# Patient Record
Sex: Female | Born: 1970
Health system: Southern US, Community
[De-identification: ages and names within clinical notes are randomized; demographics above are authoritative.]

## PROBLEM LIST (undated history)

## (undated) DIAGNOSIS — E785 Hyperlipidemia, unspecified: Secondary | ICD-10-CM

## (undated) DIAGNOSIS — E0590 Thyrotoxicosis, unspecified without thyrotoxic crisis or storm: Secondary | ICD-10-CM

## (undated) DIAGNOSIS — D649 Anemia, unspecified: Secondary | ICD-10-CM

## (undated) DIAGNOSIS — I10 Essential (primary) hypertension: Secondary | ICD-10-CM

## (undated) DIAGNOSIS — E039 Hypothyroidism, unspecified: Secondary | ICD-10-CM

## (undated) HISTORY — PX: KIDNEY STONE SURGERY: SHX686

## (undated) HISTORY — DX: Anemia, unspecified: D64.9

## (undated) HISTORY — DX: Thyrotoxicosis, unspecified without thyrotoxic crisis or storm: E05.90

## (undated) HISTORY — PX: ENDOMETRIAL ABLATION: SHX621

## (undated) HISTORY — DX: Essential (primary) hypertension: I10

## (undated) HISTORY — DX: Hyperlipidemia, unspecified: E78.5

---

## 1998-09-19 ENCOUNTER — Other Ambulatory Visit: Admission: RE | Admit: 1998-09-19 | Discharge: 1998-09-19 | Payer: Self-pay | Admitting: Obstetrics and Gynecology

## 1998-11-26 ENCOUNTER — Other Ambulatory Visit: Admission: RE | Admit: 1998-11-26 | Discharge: 1998-11-26 | Payer: Self-pay | Admitting: Obstetrics and Gynecology

## 1999-01-16 ENCOUNTER — Other Ambulatory Visit: Admission: RE | Admit: 1999-01-16 | Discharge: 1999-01-16 | Payer: Self-pay | Admitting: Obstetrics and Gynecology

## 1999-07-01 ENCOUNTER — Other Ambulatory Visit: Admission: RE | Admit: 1999-07-01 | Discharge: 1999-07-01 | Payer: Self-pay | Admitting: Obstetrics and Gynecology

## 1999-10-29 ENCOUNTER — Encounter (INDEPENDENT_AMBULATORY_CARE_PROVIDER_SITE_OTHER): Payer: Self-pay

## 1999-10-29 ENCOUNTER — Other Ambulatory Visit: Admission: RE | Admit: 1999-10-29 | Discharge: 1999-10-29 | Payer: Self-pay | Admitting: Obstetrics and Gynecology

## 2000-02-06 ENCOUNTER — Other Ambulatory Visit: Admission: RE | Admit: 2000-02-06 | Discharge: 2000-02-06 | Payer: Self-pay | Admitting: Obstetrics and Gynecology

## 2000-08-30 ENCOUNTER — Other Ambulatory Visit: Admission: RE | Admit: 2000-08-30 | Discharge: 2000-08-30 | Payer: Self-pay | Admitting: Obstetrics and Gynecology

## 2001-02-21 ENCOUNTER — Encounter: Payer: Self-pay | Admitting: Emergency Medicine

## 2001-02-21 ENCOUNTER — Emergency Department (HOSPITAL_COMMUNITY): Admission: EM | Admit: 2001-02-21 | Discharge: 2001-02-21 | Payer: Self-pay | Admitting: Emergency Medicine

## 2001-05-17 ENCOUNTER — Other Ambulatory Visit: Admission: RE | Admit: 2001-05-17 | Discharge: 2001-05-17 | Payer: Self-pay | Admitting: Obstetrics and Gynecology

## 2001-05-21 ENCOUNTER — Inpatient Hospital Stay (HOSPITAL_COMMUNITY): Admission: AD | Admit: 2001-05-21 | Discharge: 2001-05-21 | Payer: Self-pay | Admitting: *Deleted

## 2001-05-21 ENCOUNTER — Encounter: Payer: Self-pay | Admitting: Obstetrics & Gynecology

## 2001-10-12 ENCOUNTER — Inpatient Hospital Stay (HOSPITAL_COMMUNITY): Admission: AD | Admit: 2001-10-12 | Discharge: 2001-10-12 | Payer: Self-pay | Admitting: Obstetrics and Gynecology

## 2001-11-21 ENCOUNTER — Encounter (INDEPENDENT_AMBULATORY_CARE_PROVIDER_SITE_OTHER): Payer: Self-pay | Admitting: Specialist

## 2001-11-21 ENCOUNTER — Inpatient Hospital Stay (HOSPITAL_COMMUNITY): Admission: AD | Admit: 2001-11-21 | Discharge: 2001-11-26 | Payer: Self-pay | Admitting: Obstetrics and Gynecology

## 2002-01-03 ENCOUNTER — Other Ambulatory Visit: Admission: RE | Admit: 2002-01-03 | Discharge: 2002-01-03 | Payer: Self-pay | Admitting: Obstetrics and Gynecology

## 2003-04-26 ENCOUNTER — Other Ambulatory Visit: Admission: RE | Admit: 2003-04-26 | Discharge: 2003-04-26 | Payer: Self-pay | Admitting: Obstetrics and Gynecology

## 2004-11-11 ENCOUNTER — Other Ambulatory Visit: Admission: RE | Admit: 2004-11-11 | Discharge: 2004-11-11 | Payer: Self-pay | Admitting: Obstetrics and Gynecology

## 2008-03-12 ENCOUNTER — Encounter: Admission: RE | Admit: 2008-03-12 | Discharge: 2008-03-12 | Payer: Self-pay | Admitting: Internal Medicine

## 2008-03-12 IMAGING — CR DG FOOT COMPLETE 3+V*L*
3 series · 3 of 3 positions shown · non-contrast
Comparison: None

CLINICAL DATA: Pain and swelling third fourth and fifth metatarsals

LEFT FOOT - COMPLETE 3+ VIEW

[view not recorded (1 of 3)]
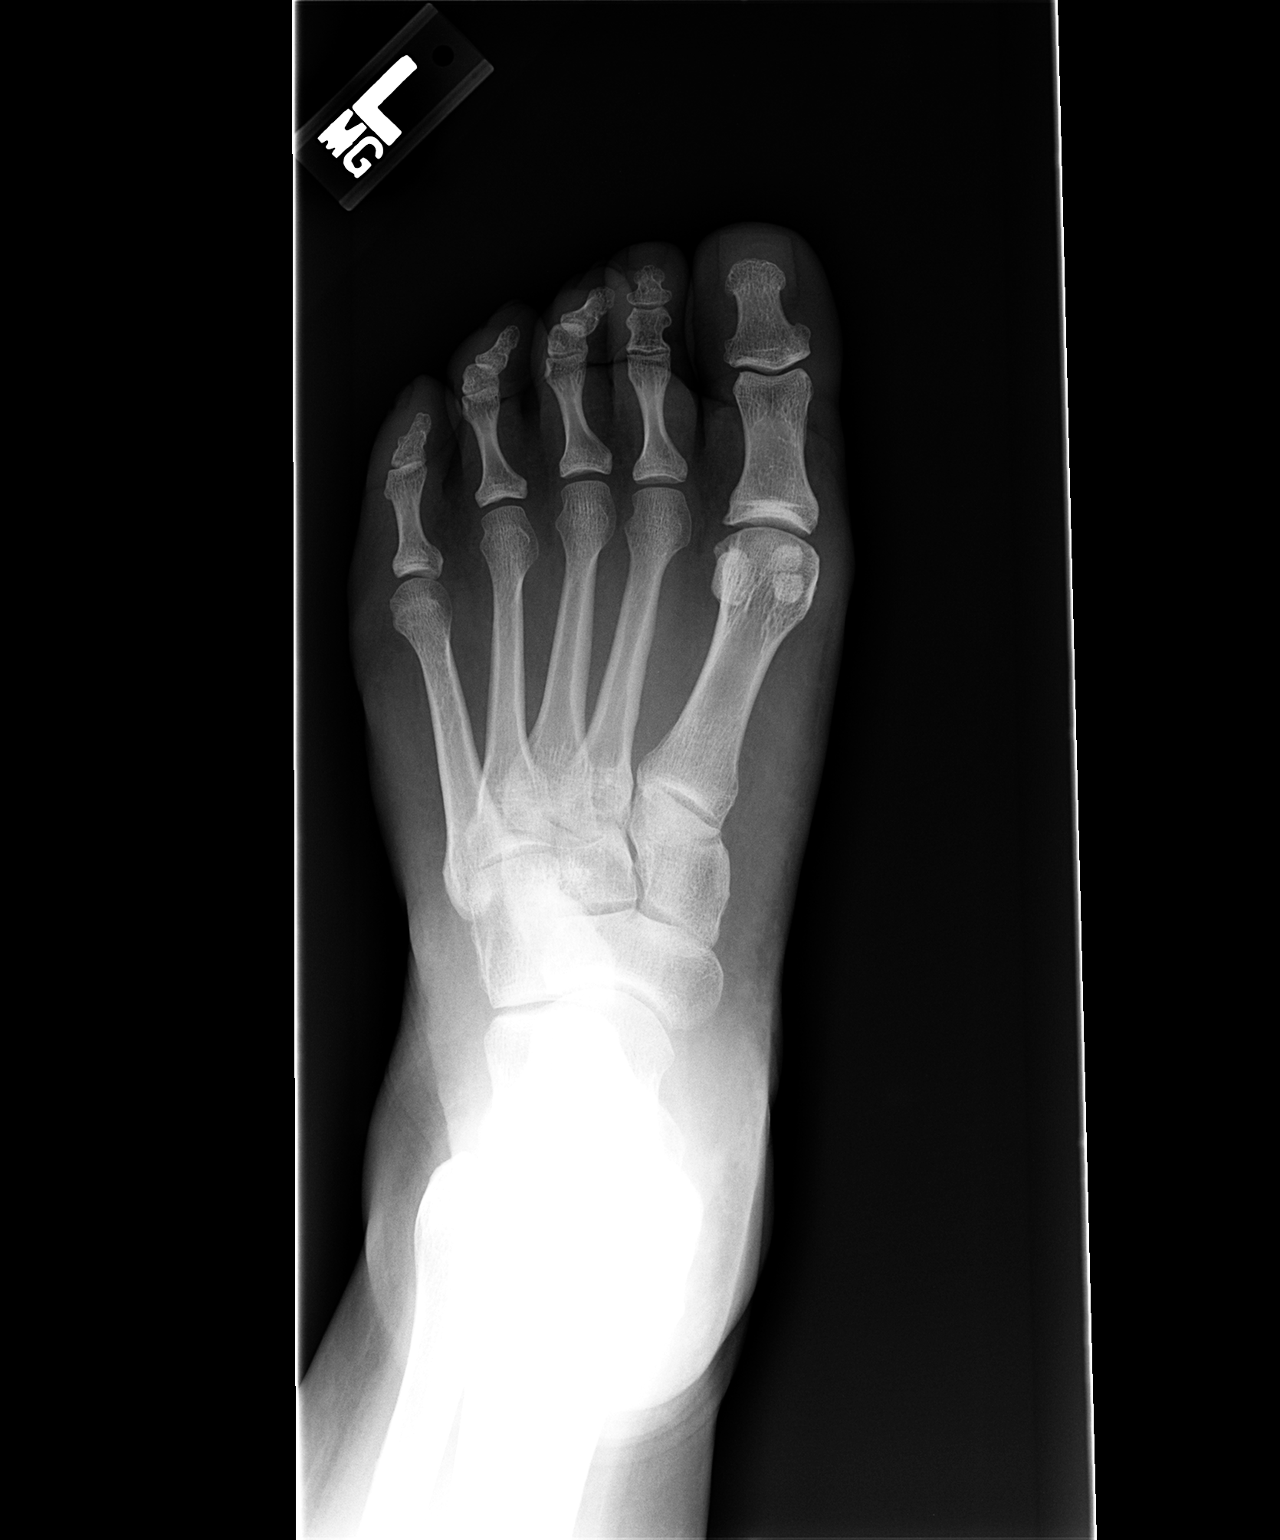

[view not recorded (2 of 3)]
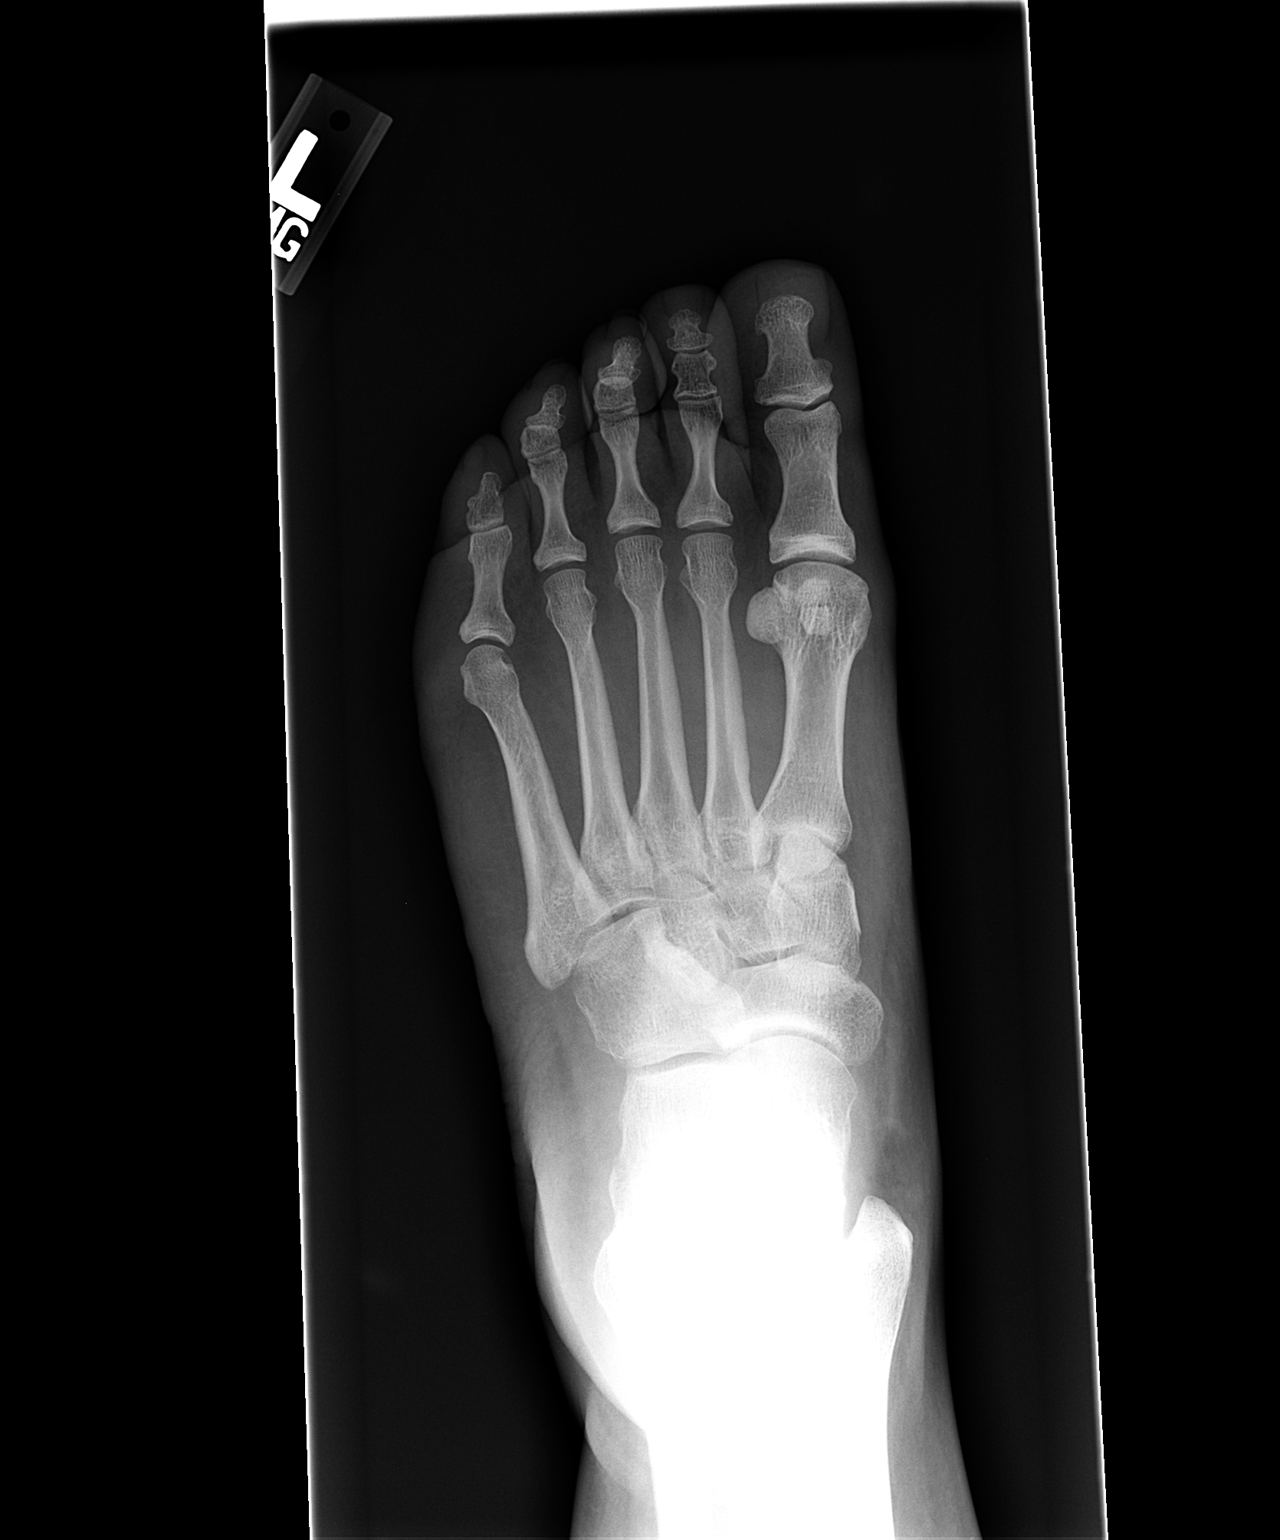

[view not recorded (3 of 3)]
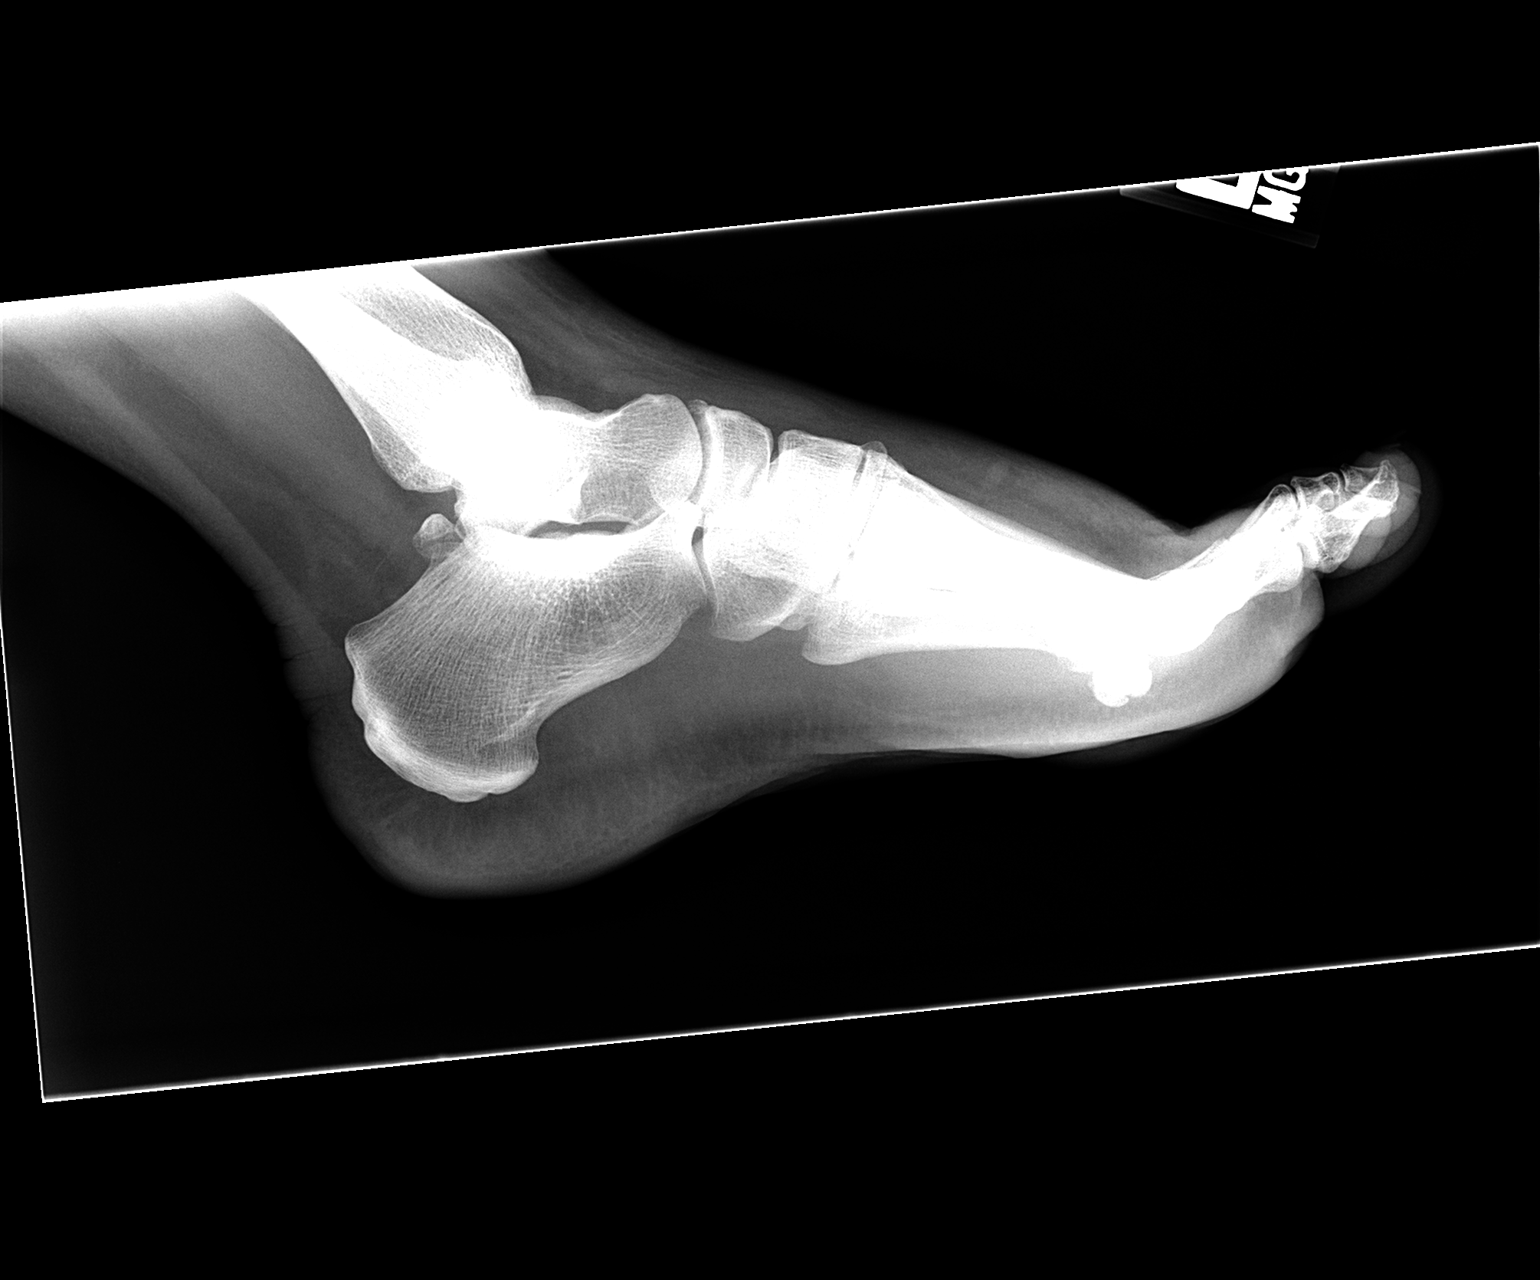

[3 of 3 positions shown; findings below may reference images not displayed]

FINDINGS: No bony abnormality is seen.  Alignment is normal.  Joint
spaces appear normal.
IMPRESSION: Negative.

## 2010-01-17 ENCOUNTER — Encounter: Admission: RE | Admit: 2010-01-17 | Discharge: 2010-01-17 | Payer: Self-pay | Admitting: Obstetrics and Gynecology

## 2010-11-02 ENCOUNTER — Encounter: Payer: Self-pay | Admitting: Obstetrics and Gynecology

## 2010-12-24 ENCOUNTER — Inpatient Hospital Stay (HOSPITAL_COMMUNITY)
Admission: AD | Admit: 2010-12-24 | Discharge: 2010-12-25 | DRG: 886 | Disposition: A | Discharge status: Home / Self Care | Payer: BC Managed Care – PPO | Source: Ambulatory Visit | Attending: Obstetrics and Gynecology | Admitting: Obstetrics and Gynecology

## 2010-12-24 ENCOUNTER — Inpatient Hospital Stay (HOSPITAL_COMMUNITY): Payer: BC Managed Care – PPO

## 2010-12-24 DIAGNOSIS — O4410 Placenta previa with hemorrhage, unspecified trimester: Principal | ICD-10-CM | POA: Diagnosis present

## 2010-12-24 LAB — URINALYSIS, ROUTINE W REFLEX MICROSCOPIC
Bilirubin Urine: NEGATIVE
Glucose, UA: NEGATIVE mg/dL
Ketones, ur: NEGATIVE mg/dL
Leukocytes, UA: NEGATIVE
Nitrite: NEGATIVE
Protein, ur: NEGATIVE mg/dL
Specific Gravity, Urine: 1.01 (ref 1.005–1.030)
Urobilinogen, UA: 0.2 mg/dL (ref 0.0–1.0)
pH: 7 (ref 5.0–8.0)

## 2010-12-24 LAB — CBC
MCH: 27.8 pg (ref 26.0–34.0)
MCHC: 32.9 g/dL (ref 30.0–36.0)
MCV: 84.7 fL (ref 78.0–100.0)
Platelets: 198 10*3/uL (ref 150–400)
RBC: 4.24 MIL/uL (ref 3.87–5.11)

## 2010-12-24 LAB — URINE MICROSCOPIC-ADD ON

## 2010-12-25 LAB — CBC
HCT: 35.1 % — ABNORMAL LOW (ref 36.0–46.0)
MCHC: 33 g/dL (ref 30.0–36.0)
MCV: 83.6 fL (ref 78.0–100.0)
Platelets: 237 10*3/uL (ref 150–400)
RDW: 14 % (ref 11.5–15.5)
WBC: 18.6 10*3/uL — ABNORMAL HIGH (ref 4.0–10.5)

## 2011-01-03 NOTE — Discharge Summary (Signed)
  NAMEGABRILLE, Danielle Burnett                 ACCOUNT NO.:  [id number]  MEDICAL RECORD NO.:  [id number]           PATIENT TYPE:  I  LOCATION:  9151                          FACILITY:  [facility]  PHYSICIAN:  Juluis Mire, M.D.   DATE OF BIRTH:  June 26, 1971  DATE OF ADMISSION:  12/24/2010 DATE OF DISCHARGE:  12/25/2010                              DISCHARGE SUMMARY   ADMITTING DIAGNOSIS:  Intrauterine pregnancy at 27 weeks with previa an onset of brownish discharge.  DISCHARGE DIAGNOSIS:  Intrauterine pregnancy at 27 weeks with previa an onset of brownish discharge.  For complete history and physical, please see dictated note course in the hospital.  The patient has some minimal bleeding as a known previous brought in for observations over the night.  Of note, she had an ultrasound, cervical length was 3.4 cm.  Fluid was subjectively normal. There was no evidence of any type of abruption.  In view of this, she was observed overnight.  She really had no further bleeding through the night, following day was not contracting, fetal heart weight was reassuring.  She is only 27 weeks.  We observed it throughout Thursday. No further bleeding was noted.  Fetal activity remained normal.  There was no signs of progressive bleeding or abruption.  The patient can be discharged home at this time.  In terms of complications, none were encountered in the hospital, the patient was discharged home in stable condition.  DISPOSITION:  The patient is going to be at rest at home.  No vaginal entrance.  Report any bleeding whatsoever, follow up in the office early next week.     Juluis Mire, M.D.     JSM/MEDQ  D:  12/25/2010  T:  12/26/2010  Job:  [id number]  Electronically Signed by Richardean Chimera M.D. on 01/03/2011 06:04:53 AM

## 2011-01-22 ENCOUNTER — Inpatient Hospital Stay (HOSPITAL_COMMUNITY): Admission: AD | Admit: 2011-01-22 | Payer: Self-pay | Admitting: Family Medicine

## 2011-02-27 NOTE — Op Note (Signed)
[facility]  Patient:    Danielle Burnett, SANTOYO Visit Number: [id number] MRN: [id number]          Service Type: OBS Location: 910A 9113 01 Attending Physician:  Marcelle Overlie Dictated by:   Guy Sandifer. Arleta Creek, M.D. Proc. Date: 11/22/01 Admit Date:  11/21/2001                             Operative Report  PREOPERATIVE DIAGNOSES:       1. Intrauterine pregnancy at 37 weeks estimated                                  gestational age.                               2. Arrestive descent.  POSTOPERATIVE DIAGNOSES:      1. Intrauterine pregnancy at 37 weeks estimated                                  gestational age.                               2. Arrestive descent.  OPERATION:                    Low transverse cesarean section.  SURGEON:                      Guy Sandifer. Arleta Creek, M.D.  ANESTHESIA:                   Epidural - Gretta Cool., M.D.  ESTIMATED BLOOD LOSS:         800 cc.  FINDINGS:                     Viable female infant.  Apgars of 8 and 9 at one and five minutes respectively.  Arterial cord pH 7.26.  Birth weight 8 pounds and 7 ounces.  INDICATIONS AND CONSENT:      This patient is a 40 year old married white female, G2, P0, with an EDC of December 08, 2001, who underwent spontaneous rupture of membranes with clear fluid last p.m.  She had steady progress with Pitocin augmentation until she reached complete pushing.  There was a fair amount of molding and caput noted.  The baby was felt to be in the OT to OP presentation.  The patient pushed for approximately 40 minutes and wanted a break for juice.  She then rested a short while, pushed again for approximately 40-45 minutes, and again made no progress, essentially below the zero station.  Diagnosis of arrestive descent was made.  Recommendation was cesarean section was made.  Potential risks and complications were discussed with the patient prior to surgery including, but not  limited to infection, organ damage, bleeding, and transfusion.  All questions were answered and consent was signed on the chart.  DESCRIPTION OF PROCEDURE:     The patient was taken to the operating room where epidural anesthetic was augmented to a surgical level.  She was then placed in the dorsal supine position with a 15 degree left lateral wedge.  A Foley catheter was in the bladder.  She was prepped and draped in a sterile fashion.  After testing for adequate epidural anesthesia, the skin was entered through a Pfannenstiel incision, and dissection was carried out in layers to the peritoneum.  The peritoneum was then incised and extended superiorly and inferiorly  The vesicouterine peritoneum was taken down cephalolaterally.  The bladder flap was developed and the bladder blade is placed.  The uterus is incised in a low transverse manner.  The uterine cavity was entered bluntly with a hemostat and the uterine incision was extended cephalolaterally with the fingers.  The vertex is noted to be in the right occiput posterior position.  The vertex is delivered without difficulty.  Nuchal cord x1 is reduced.  The oral and nasopharynx are suctioned, and the baby is delivered. The cord is clamped and cut and the infant is handed to the waiting pediatrics team.  Placenta is manually delivered intact.  The intrauterine cavity is clean and normal in contour.  It should be noted, the pelvis is quite narrow. The uterus is then closed in a running locking fashion with 0 Monocryl suture. A figure-of-eight suture is placed at the left angle for complete hemostasis. Irrigation was carried out and all returns is clear.  The anterior peritoneum was then closed in a running fashion with 0 Monocryl suture which was also used to reapproximate the pyramidalis muscle in the midline.  The anterior rectus muscle was then closed in a running fashion with 0 PDS suture.  The skin was closed with the clips.  All  counts are correct.  The patient is transferred to the recovery room in stable condition. Dictated by:   Guy Sandifer Arleta Creek, M.D. Attending Physician:  Marcelle Overlie DD:  11/22/01 TD:  11/23/01 Job: [id number]

## 2011-02-27 NOTE — Discharge Summary (Signed)
[facility]  Patient:    Danielle Burnett, Danielle Burnett Visit Number: [id number] MRN: [id number]          Service Type: OBS Location: 910A 9113 01 Attending Physician:  Marcelle Overlie Dictated by:   Danie Chandler, R.N. Admit Date:  11/21/2001 Discharge Date: 11/26/2001                             Discharge Summary  ADMITTING DIAGNOSES:          Intrauterine pregnancy at [redacted] weeks gestation with spontaneous rupture of membranes.  DISCHARGE DIAGNOSES:          Intrauterine pregnancy at [redacted] weeks gestation with spontaneous rupture of membranes, arrest of descent.  PROCEDURE:                    On November 22, 2001 a primary low transverse cesarean section.  REASON FOR ADMISSION:         The patient is a 40 year old married white female gravida 2, para 0 with an estimated date of confinement of December 08, 2001 who underwent spontaneous of ruptured membranes with clear fluid on the evening of November 21, 2001.  She made steady progress with Pitocin augmentation until she reached complete and pushing.  There was a fair amount of molding and caput noted.  The baby was felt to be in the OT to OP presentation.  The patient pushed for approximately 40 minutes and then wanted a break.  She rested for a short while and then pushed again for approximately 40-45 minutes and again made no progress essentially below the 0 station. Diagnosis of arrest of descent was made.  Recommendation for cesarean section was made.  HOSPITAL COURSE:              The patient was taken to the operating room and underwent the above named procedure without complications.  This was productive of a viable female infant with Apgars of 8 at one minute and 9 at five minutes and an arterial cord pH of 7.26.  Postoperatively on day #1 the patient had a good return of bowel function and was ambulating well without difficulty.  Hemoglobin 10.3, hematocrit 30.7, white blood cell count 15.7. On  postoperative day #2 the patient was tolerating a regular diet.  She had good pain control and was without complaints.  Postoperative day #3 vital signs were stable.  She was afebrile.  She was discharged home on postoperative day #4.  CONDITION ON DISCHARGE:       Good.  DIET:                         Regular as tolerated.  ACTIVITY:                     No heavy lifting, no driving, no vaginal entry.  FOLLOWUP:                     She is to follow up in the office in one to two weeks for incision check.  She is to call for temperature greater than 100 degrees, persistent nausea or vomiting, heavy vaginal bleeding, and/or redness or drainage from the incision site.  DISCHARGE MEDICATIONS:        1. Prenatal vitamins one p.o. q.d.  2. Motrin 600 mg one p.o. q.6h. p.r.n. pain.                               3. Tylox one to two p.o. q.4-6h. p.r.n. pain. Dictated by:   Danie Chandler, R.N. Attending Physician:  Marcelle Overlie DD:  12/09/01 TD:  12/09/01 Job: [id number]

## 2011-03-10 ENCOUNTER — Encounter (HOSPITAL_COMMUNITY): Payer: BC Managed Care – PPO

## 2011-03-10 LAB — CBC
Hemoglobin: 11.8 g/dL — ABNORMAL LOW (ref 12.0–15.0)
MCH: 27.8 pg (ref 26.0–34.0)
MCHC: 33 g/dL (ref 30.0–36.0)
Platelets: 171 10*3/uL (ref 150–400)
RBC: 4.25 MIL/uL (ref 3.87–5.11)

## 2011-03-10 LAB — RPR: RPR Ser Ql: NONREACTIVE

## 2011-03-18 ENCOUNTER — Inpatient Hospital Stay (HOSPITAL_COMMUNITY)
Admission: RE | Admit: 2011-03-18 | Discharge: 2011-03-20 | DRG: 371 | Disposition: A | Discharge status: Home / Self Care | Payer: BC Managed Care – PPO | Source: Ambulatory Visit | Attending: Obstetrics and Gynecology | Admitting: Obstetrics and Gynecology

## 2011-03-18 ENCOUNTER — Other Ambulatory Visit: Payer: Self-pay | Admitting: Obstetrics and Gynecology

## 2011-03-18 DIAGNOSIS — Z01812 Encounter for preprocedural laboratory examination: Secondary | ICD-10-CM

## 2011-03-18 DIAGNOSIS — O09529 Supervision of elderly multigravida, unspecified trimester: Secondary | ICD-10-CM | POA: Diagnosis present

## 2011-03-18 DIAGNOSIS — Z302 Encounter for sterilization: Secondary | ICD-10-CM

## 2011-03-18 DIAGNOSIS — Z01818 Encounter for other preprocedural examination: Secondary | ICD-10-CM

## 2011-03-18 DIAGNOSIS — O34219 Maternal care for unspecified type scar from previous cesarean delivery: Principal | ICD-10-CM | POA: Diagnosis present

## 2011-03-18 LAB — SURGICAL PCR SCREEN: Staphylococcus aureus: NEGATIVE

## 2011-03-19 LAB — CBC
MCH: 28.2 pg (ref 26.0–34.0)
MCHC: 33.6 g/dL (ref 30.0–36.0)
MCV: 84 fL (ref 78.0–100.0)
Platelets: 160 10*3/uL (ref 150–400)
RDW: 14.6 % (ref 11.5–15.5)

## 2011-04-10 NOTE — Discharge Summary (Signed)
  [name], Danielle Burnett                 ACCOUNT NO.:  [id number]  MEDICAL RECORD NO.:  [id number]  LOCATION:  9125                          [facility]ACILITY:  [facility]  PHYSICIAN:  Guy Sandifer. Henderson Cloud, M.D. DATE OF BIRTH:  [birth year]  DATE OF ADMISSION:  03/18/2011 DATE OF DISCHARGE:  03/20/2011                              DISCHARGE SUMMARY   ADMITTING DIAGNOSES: 1. Intrauterine pregnancy at 53 weeks' estimated gestational age. 2. Previous cesarean delivery, desires repeat. 3. Multiparity, desires permanent sterilization.  DISCHARGE DIAGNOSES: 1. Status post low transverse cesarean section. 2. Viable female infant. 3. Permanent sterilization.  PROCEDURES: 1. Repeat low transverse cesarean section. 2. Bilateral tubal ligation.  REASON FOR ADMISSION:  Please see dictated H and P.  HOSPITAL COURSE:  The patient is a 40 year old white married female, gravida 2, para 1 that presented to [facility] for scheduled cesarean section.  The patient had had a previous cesarean delivery and desired repeat.  Due to multiparity, the patient also requested permanent sterilization.  On the morning of admission, the patient was taken to the operating room where spinal anesthesia was administered without difficulty.  A low transverse incision was made with delivery of a viable female infant weighing 7 pounds and 10 ounces, Apgars of 8 at 1 minute and 9 at 5 minutes.  Arterial cord pH of 7.28. Bilateral tubal ligation was performed without difficulty.  The patient tolerated the procedure well and was taken to the recovery room in stable condition.  On postoperative day #1, the patient was without complaint.  Vital signs were stable.  Abdomen soft.  Fundus firm and nontender.  Abdominal dressing noted to have small amount of drainage noted on the bandage.  Foley had been discontinued, however, she had not voided at the time of rounding.  Laboratory findings revealed hemoglobin 9.9 and platelet  count 160,000.  On postoperative day #2, the patient did desire early discharge.  Vital signs were stable.  Abdomen soft. Fundus firm and nontender with good return of bowel function.  Incision was clean, dry, and intact.  Staples were intact.  Discharge instructions were reviewed and the patient was later discharged home.  CONDITION ON DISCHARGE:  Stable.  DIET:  Regular as tolerated.  ACTIVITY:  No heavy lifting, no driving x2 weeks, no vaginal entry.  FOLLOWUP:  The patient is to follow up in the office in 2-3 days for staple removal.  She is to call for temperature greater than 100 degrees, persistent nausea, vomiting, heavy vaginal bleeding, and/or redness or drainage from incisional site.  DISCHARGE MEDICATIONS: 1. Percocet 5/325, #30, 1 p.o. every 4-6 hours. 2. Motrin 600 mg every 6 hours. 3. Prenatal vitamins 1 p.o. daily. 4. Colace 1 p.o. daily p.r.n.     Julio Sicks, N.P.   ______________________________ Guy Sandifer. Henderson Cloud, M.D.    CC/MEDQ  D:  03/20/2011  T:  03/21/2011  Job:  [id number]  Electronically Signed by Julio Sicks N.P. on 03/24/2011 08:35:51 AM Electronically Signed by Harold Hedge M.D. on 04/10/2011 09:14:05 AM

## 2011-04-20 NOTE — H&P (Signed)
  Danielle Burnett, Danielle Burnett                 ACCOUNT NO.:  [id number]  MEDICAL RECORD NO.:  [id number]  LOCATION:  9125                          [facility]ACILITY:  [facility]  PHYSICIAN:  Dineen Kid. Rana Snare, M.D.    DATE OF BIRTH:  Feb 20, 1971  DATE OF ADMISSION:  03/18/2011 DATE OF DISCHARGE:                             HISTORY & PHYSICAL   HISTORY OF PRESENT ILLNESS:  Danielle Burnett is a 40 year old G3, P1, she is currently at 20 weeks' gestational age with an [facility] of March 25, 2011. She presents today for repeat cesarean section.  Her pregnancy has been complicated by advanced maternal age, she did have a normal first trimester screen and declined amniocentesis, furthermore was complicated by complete placenta previa which has subsequently resolved.  The placenta is on the posterior wall.  She also had preterm labor and a small subchorionic hemorrhage which also has resolved.  She has had serial ultrasounds as well as nonstress test and monitoring which has remained normal.  She has also, towards the end of the pregnancy, had elevated blood pressures which responded to rest with no evidence of preeclampsia with normal recent labs.  She is group B strep negative.  PAST MEDICAL HISTORY:  Significant for history of kidney stones.  PAST SURGICAL HISTORY:  Cesarean section for arrest of descent of a 8- pound and 7-ounce baby in 2003.  MEDICATIONS:  Prenatal vitamins.  She has no known drug allergies.  PHYSICAL EXAMINATION:  VITAL SIGNS:  Her blood pressure is 142/92, recheck was 118/78. HEART:  Regular rate and rhythm. LUNGS:  Clear to auscultation bilaterally. ABDOMEN:  Gravid, nontender.  Cervix is fingertip, 50%, -3 station. Fetal heart rates in the 140s-150s.  IMPRESSION:  Intrauterine pregnancy at 39 weeks, previous cesarean section, desires repeat.  PLAN:  Repeat low segment transverse cesarean section, multiparity, and desires sterility.  Plan tubal ligation.  I discussed the risk of the procedures at  length which include but not limited to risk of infection, bleeding, damage to the uterus, tubes, ovary, bowel, bladder, fetus, risk of tubal failure quoted at 02/999.  She gives informed consent and wishes to proceed.     Dineen Kid Rana Snare, M.D.    DCL/MEDQ  D:  03/18/2011  T:  03/19/2011  Job:  [id number]  Electronically Signed by Candice Camp M.D. on 04/20/2011 07:53:50 AM

## 2011-04-20 NOTE — Op Note (Signed)
NAMELILLYANNA, Danielle Burnett                 ACCOUNT NO.:  [id number]  MEDICAL RECORD NO.:  [id number]  LOCATION:  9125                          [facility]  PHYSICIAN:  Dineen Kid. Rana Snare, M.D.    DATE OF BIRTH:  [birth year]  DATE OF PROCEDURE:  03/18/2011 DATE OF DISCHARGE:                              OPERATIVE REPORT   PREOPERATIVE DIAGNOSES:  Intrauterine pregnancy at 64 weeks' gestational age, previous cesarean section with desire to repeat, and multiparity, desires sterility.  POSTOPERATIVE DIAGNOSES:  Intrauterine pregnancy at 38 weeks' gestational age, previous cesarean section with desire to repeat, and multiparity, desires sterility.  PROCEDURE:  Repeat low segment transverse cesarean section and bilateral tubal ligation with Filshie clip.  SURGEON:  Dineen Kid. Rana Snare, MD  ANESTHESIA:  Spinal.  INDICATIONS:  Danielle Burnett is a 40 year old whose pregnancy has been complicated by advanced maternal age, also with a history of previa and preterm labor and vaginal bleeding.  Previa has resolved.  Placenta is posterior and the bleeding also resolved.  She had previous cesarean section, she desires repeat.  She also has multiparity, desires sterility.  She presents for repeat cesarean section and tubal.  Risks and benefits of the procedure were discussed at length which include but not limited to risks of infection, bleeding, damage to the uterus, tubes, ovary, bowel, bladder, fetus, tubal failure quoted 02/999.  She also desires a small skin tag at the incision myotomy to be removed and gives informed consent to remove that as well.  FINDINGS AT THE TIME OF SURGERY:  Viable female infant, Apgars were 8 and 9, pH arterial 7.28, weight is 7 pounds and 10 ounces.  DESCRIPTION OF PROCEDURE:  After adequate analgesia, the patient was placed in the supine position, left lateral tilt.  She was sterilely prepped and draped.  Bladder was sterilely drained with a Foley catheter.  A Pfannenstiel  was made two fingerbreadths above the pubic symphysis.  The small skin tag right at the incision line was easily removed with a scalpel.  The incision was then taken down sharply to fascia which was incised transversely and extended superiorly and inferiorly off the bellies of rectus muscle which was separated sharply in midline.  Peritoneum was entered sharply.  Bladder flap created and placed behind the bladder blade.  Low segment myotomy incision was made down to the amniotic sac and extended laterally with the operator's fingertips.  Amniotomy was performed noting a port wine-appearing fluid consistent with her previous history of bleeding.  Nuchal cord x1 was reduced.  The infant's nares and pharynx were suctioned.  The infant was delivered, cord clamped, cut, and handed to the pediatrician for resuscitation.  Cord blood was then obtained.  Placenta was extracted manually.  Uterus was exteriorized, wiped clean with a dry lap.  The myotomy incision was closed in two layers, first being a running locking layer, second being imbricating layer of 0 Monocryl suture.  The right fallopian tube was identified by the fimbriated end.  Midportion of the tube grasped with Filshie clip was placed across the midportion of the fallopian tube with good placement noted.  Left fallopian tube was identified by the fimbriated end.  Midportion of the fallopian tube was grasped.  Filshie clip was placed across the midportion of the fallopian tube with good placement noted.  Uterus was placed back into the abdominal cavity and after copious amount of irrigation adequate hemostasis was assured.  Peritoneum was closed with 0 Monocryl suture. Rectus muscle was plicated in the midline.  Irrigation was applied and after adequate hemostasis the fascia was then closed with a #1 PDS in a running fashion.  Irrigation was applied after adequate hemostasis, skin was stapled, Steri-Strips were applied.  The patient  tolerated the procedure well, was stable, and transferred to the recovery room. Sponge and instrument counts were normal x3.  Estimated blood loss 900 mL.  The patient received 1 g of cefotetan preoperatively.     Dineen Kid Rana Snare, M.D.     DCL/MEDQ  D:  03/18/2011  T:  03/19/2011  Job:  [id number]  Electronically Signed by Candice Camp M.D. on 04/20/2011 07:53:52 AM

## 2011-04-23 ENCOUNTER — Encounter (HOSPITAL_COMMUNITY)
Admission: RE | Admit: 2011-04-23 | Discharge: 2011-04-23 | Disposition: A | Discharge status: Home / Self Care | Payer: BC Managed Care – PPO | Source: Ambulatory Visit | Attending: Obstetrics and Gynecology | Admitting: Obstetrics and Gynecology

## 2011-04-23 DIAGNOSIS — O923 Agalactia: Secondary | ICD-10-CM | POA: Insufficient documentation

## 2011-05-24 ENCOUNTER — Encounter (HOSPITAL_COMMUNITY)
Admission: RE | Admit: 2011-05-24 | Discharge: 2011-05-24 | Disposition: A | Discharge status: Home / Self Care | Payer: BC Managed Care – PPO | Source: Ambulatory Visit | Attending: Obstetrics and Gynecology | Admitting: Obstetrics and Gynecology

## 2011-05-24 DIAGNOSIS — O923 Agalactia: Secondary | ICD-10-CM | POA: Insufficient documentation

## 2011-05-25 ENCOUNTER — Encounter (HOSPITAL_COMMUNITY): Payer: BC Managed Care – PPO

## 2011-06-24 ENCOUNTER — Encounter (HOSPITAL_COMMUNITY)
Admission: RE | Admit: 2011-06-24 | Discharge: 2011-06-24 | Disposition: A | Discharge status: Home / Self Care | Payer: BC Managed Care – PPO | Source: Ambulatory Visit | Attending: Obstetrics and Gynecology | Admitting: Obstetrics and Gynecology

## 2011-06-24 DIAGNOSIS — O923 Agalactia: Secondary | ICD-10-CM | POA: Insufficient documentation

## 2011-06-25 ENCOUNTER — Other Ambulatory Visit: Payer: Self-pay | Admitting: Dermatology

## 2011-07-09 ENCOUNTER — Other Ambulatory Visit: Payer: Self-pay | Admitting: Dermatology

## 2011-07-25 ENCOUNTER — Encounter (HOSPITAL_COMMUNITY)
Admission: RE | Admit: 2011-07-25 | Discharge: 2011-07-25 | Disposition: A | Discharge status: Home / Self Care | Payer: BC Managed Care – PPO | Source: Ambulatory Visit | Attending: Obstetrics and Gynecology | Admitting: Obstetrics and Gynecology

## 2011-07-25 DIAGNOSIS — O923 Agalactia: Secondary | ICD-10-CM | POA: Insufficient documentation

## 2011-08-17 ENCOUNTER — Emergency Department (HOSPITAL_COMMUNITY)
Admission: EM | Admit: 2011-08-17 | Discharge: 2011-08-17 | Disposition: A | Discharge status: Home / Self Care | Payer: Worker's Compensation | Attending: Emergency Medicine | Admitting: Emergency Medicine

## 2011-08-17 DIAGNOSIS — Y92410 Unspecified street and highway as the place of occurrence of the external cause: Secondary | ICD-10-CM | POA: Insufficient documentation

## 2011-08-17 DIAGNOSIS — S161XXA Strain of muscle, fascia and tendon at neck level, initial encounter: Secondary | ICD-10-CM

## 2011-08-17 DIAGNOSIS — S139XXA Sprain of joints and ligaments of unspecified parts of neck, initial encounter: Secondary | ICD-10-CM | POA: Insufficient documentation

## 2011-08-17 HISTORY — DX: Hypothyroidism, unspecified: E03.9

## 2011-08-17 MED ORDER — IBUPROFEN 800 MG PO TABS
800.0000 mg | ORAL_TABLET | Freq: Once | ORAL | Status: AC
  Administered 2011-08-17: 800 mg via ORAL
  Filled 2011-08-17: qty 1

## 2011-08-17 MED ORDER — DIAZEPAM 5 MG/ML IJ SOLN: 5.0000 mg | Freq: Once | INTRAMUSCULAR | Status: DC

## 2011-08-17 MED ORDER — ACETAMINOPHEN-CODEINE #3 300-30 MG PO TABS: 1.0000 | ORAL_TABLET | Freq: Four times a day (QID) | ORAL | Status: AC | PRN

## 2011-08-17 MED ORDER — DIAZEPAM 5 MG PO TABS: ORAL_TABLET | ORAL | Status: DC

## 2011-08-17 MED ORDER — IBUPROFEN 800 MG PO TABS: 800.0000 mg | ORAL_TABLET | Freq: Three times a day (TID) | ORAL | Status: AC
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
## 2011-08-17 NOTE — ED Notes (Signed)
Pt medicated with ibuprofen, refused valium
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
## 2011-08-17 NOTE — ED Notes (Signed)
Patient discharged at this time. Pt walked to the discharge window. Unable to take patient out of system due to EPIC conflict and EPIC error.
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
## 2011-08-18 NOTE — ED Provider Notes (Signed)
Medical screening examination/treatment/procedure(s) were performed by non-physician practitioner and as supervising physician I was immediately available for consultation/collaboration.   Benny Lennert, MD 08/18/11 [phone]

## 2011-08-25 ENCOUNTER — Encounter (HOSPITAL_COMMUNITY)
Admission: RE | Admit: 2011-08-25 | Discharge: 2011-08-25 | Disposition: A | Discharge status: Home / Self Care | Payer: BC Managed Care – PPO | Source: Ambulatory Visit | Attending: Obstetrics and Gynecology | Admitting: Obstetrics and Gynecology

## 2011-08-25 DIAGNOSIS — O923 Agalactia: Secondary | ICD-10-CM | POA: Insufficient documentation

## 2011-09-25 ENCOUNTER — Encounter (HOSPITAL_COMMUNITY)
Admission: RE | Admit: 2011-09-25 | Discharge: 2011-09-25 | Disposition: A | Discharge status: Home / Self Care | Payer: BC Managed Care – PPO | Source: Ambulatory Visit | Attending: Obstetrics and Gynecology | Admitting: Obstetrics and Gynecology

## 2011-09-25 DIAGNOSIS — O923 Agalactia: Secondary | ICD-10-CM | POA: Insufficient documentation

## 2011-10-15 ENCOUNTER — Emergency Department (HOSPITAL_COMMUNITY): Payer: Worker's Compensation

## 2011-10-15 ENCOUNTER — Encounter (HOSPITAL_COMMUNITY)
Admission: EM | Disposition: A | Discharge status: Home / Self Care | Payer: Self-pay | Source: Home / Self Care | Attending: Specialist

## 2011-10-15 ENCOUNTER — Encounter (HOSPITAL_COMMUNITY): Payer: Self-pay | Admitting: Physical Medicine and Rehabilitation

## 2011-10-15 ENCOUNTER — Other Ambulatory Visit: Payer: Self-pay

## 2011-10-15 ENCOUNTER — Inpatient Hospital Stay (HOSPITAL_COMMUNITY)
Admission: EM | Admit: 2011-10-15 | Discharge: 2011-10-17 | DRG: 494 | Disposition: A | Discharge status: Home / Self Care | Payer: Worker's Compensation | Attending: Specialist | Admitting: Specialist

## 2011-10-15 ENCOUNTER — Emergency Department (HOSPITAL_COMMUNITY): Payer: Worker's Compensation | Admitting: Anesthesiology

## 2011-10-15 ENCOUNTER — Encounter (HOSPITAL_COMMUNITY): Payer: Self-pay | Admitting: Anesthesiology

## 2011-10-15 DIAGNOSIS — S82843A Displaced bimalleolar fracture of unspecified lower leg, initial encounter for closed fracture: Principal | ICD-10-CM | POA: Diagnosis present

## 2011-10-15 DIAGNOSIS — S93336A Other dislocation of unspecified foot, initial encounter: Secondary | ICD-10-CM | POA: Diagnosis present

## 2011-10-15 DIAGNOSIS — E039 Hypothyroidism, unspecified: Secondary | ICD-10-CM | POA: Diagnosis present

## 2011-10-15 DIAGNOSIS — S82899A Other fracture of unspecified lower leg, initial encounter for closed fracture: Secondary | ICD-10-CM

## 2011-10-15 DIAGNOSIS — Y998 Other external cause status: Secondary | ICD-10-CM

## 2011-10-15 DIAGNOSIS — Y92410 Unspecified street and highway as the place of occurrence of the external cause: Secondary | ICD-10-CM

## 2011-10-15 HISTORY — PX: ORIF ANKLE FRACTURE: SHX5408

## 2011-10-15 LAB — POCT I-STAT, CHEM 8
BUN: 8 mg/dL (ref 6–23)
Chloride: 105 mEq/L (ref 96–112)
Creatinine, Ser: 0.7 mg/dL (ref 0.50–1.10)
Glucose, Bld: 103 mg/dL — ABNORMAL HIGH (ref 70–99)
Potassium: 3.2 mEq/L — ABNORMAL LOW (ref 3.5–5.1)
Sodium: 140 mEq/L (ref 135–145)
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
## 2011-10-15 SURGERY — OPEN REDUCTION INTERNAL FIXATION (ORIF) ANKLE FRACTURE
Anesthesia: General | Laterality: Right

## 2011-10-15 SURGERY — OPEN REDUCTION INTERNAL FIXATION (ORIF) ANKLE FRACTURE
Anesthesia: General | Site: Ankle | Laterality: Right | Wound class: Clean

## 2011-10-15 MED ORDER — ONDANSETRON HCL 4 MG/2ML IJ SOLN: 4.0000 mg | Freq: Four times a day (QID) | INTRAMUSCULAR | Status: DC | PRN

## 2011-10-15 MED ORDER — PROPOFOL 10 MG/ML IV EMUL
INTRAVENOUS | Status: DC | PRN
  Administered 2011-10-15: 50 mg via INTRAVENOUS
  Administered 2011-10-15: 150 mg via INTRAVENOUS

## 2011-10-15 MED ORDER — DIPHENHYDRAMINE HCL 12.5 MG/5ML PO ELIX: 12.5000 mg | ORAL_SOLUTION | ORAL | Status: DC | PRN

## 2011-10-15 MED ORDER — ASPIRIN 325 MG PO TABS
325.0000 mg | ORAL_TABLET | Freq: Two times a day (BID) | ORAL | Status: DC
  Administered 2011-10-16 – 2011-10-17 (×3): 325 mg via ORAL
  Filled 2011-10-15 (×4): qty 1

## 2011-10-15 MED ORDER — CEFAZOLIN SODIUM-DEXTROSE 2-3 GM-% IV SOLR
2.0000 g | Freq: Four times a day (QID) | INTRAVENOUS | Status: AC
  Administered 2011-10-16 (×3): 2 g via INTRAVENOUS
  Filled 2011-10-15 (×3): qty 50

## 2011-10-15 MED ORDER — DOCUSATE SODIUM 100 MG PO CAPS
100.0000 mg | ORAL_CAPSULE | Freq: Two times a day (BID) | ORAL | Status: DC
  Administered 2011-10-16 – 2011-10-17 (×4): 100 mg via ORAL
  Filled 2011-10-15 (×5): qty 1

## 2011-10-15 MED ORDER — DEXAMETHASONE SODIUM PHOSPHATE 4 MG/ML IJ SOLN
INTRAMUSCULAR | Status: DC | PRN
  Administered 2011-10-15: 8 mg via INTRAVENOUS

## 2011-10-15 MED ORDER — SUCCINYLCHOLINE CHLORIDE 20 MG/ML IJ SOLN
INTRAMUSCULAR | Status: DC | PRN
  Administered 2011-10-15: 80 mg via INTRAVENOUS

## 2011-10-15 MED ORDER — FENTANYL CITRATE 0.05 MG/ML IJ SOLN
INTRAMUSCULAR | Status: DC | PRN
  Administered 2011-10-15: 50 ug via INTRAVENOUS
  Administered 2011-10-15: 100 ug via INTRAVENOUS
  Administered 2011-10-15: 50 ug via INTRAVENOUS

## 2011-10-15 MED ORDER — HYDROMORPHONE HCL PF 1 MG/ML IJ SOLN
1.0000 mg | Freq: Once | INTRAMUSCULAR | Status: AC
  Administered 2011-10-15: 1 mg via INTRAVENOUS
  Filled 2011-10-15: qty 1

## 2011-10-15 MED ORDER — HYDROMORPHONE HCL PF 1 MG/ML IJ SOLN
0.2500 mg | INTRAMUSCULAR | Status: DC | PRN
  Administered 2011-10-15 (×4): 0.5 mg via INTRAVENOUS

## 2011-10-15 MED ORDER — HYDROCODONE-ACETAMINOPHEN 5-325 MG PO TABS
1.0000 | ORAL_TABLET | ORAL | Status: DC | PRN
  Administered 2011-10-16: 2 via ORAL
  Administered 2011-10-16 (×2): 1 via ORAL
  Administered 2011-10-16 – 2011-10-17 (×3): 2 via ORAL
  Filled 2011-10-15: qty 2
  Filled 2011-10-15: qty 1
  Filled 2011-10-15 (×3): qty 2
  Filled 2011-10-15: qty 1

## 2011-10-15 MED ORDER — KETAMINE HCL 10 MG/ML IJ SOLN
INTRAMUSCULAR | Status: DC | PRN
  Administered 2011-10-15: 25 mg via INTRAVENOUS
  Administered 2011-10-15 (×3): 10 mg via INTRAVENOUS

## 2011-10-15 MED ORDER — FENTANYL CITRATE 0.05 MG/ML IJ SOLN
100.0000 ug | Freq: Once | INTRAMUSCULAR | Status: AC
  Administered 2011-10-15: 100 ug via INTRAVENOUS

## 2011-10-15 MED ORDER — HYDROMORPHONE HCL PF 1 MG/ML IJ SOLN
0.5000 mg | INTRAMUSCULAR | Status: DC | PRN
  Administered 2011-10-15 – 2011-10-16 (×3): 0.5 mg via INTRAVENOUS
  Filled 2011-10-15 (×3): qty 1

## 2011-10-15 MED ORDER — METHOCARBAMOL 100 MG/ML IJ SOLN
500.0000 mg | Freq: Four times a day (QID) | INTRAVENOUS | Status: DC | PRN
  Filled 2011-10-15: qty 5

## 2011-10-15 MED ORDER — OXYCODONE-ACETAMINOPHEN 5-325 MG PO TABS: 1.0000 | ORAL_TABLET | ORAL | Status: DC | PRN

## 2011-10-15 MED ORDER — LEVOTHYROXINE SODIUM 88 MCG PO TABS
88.0000 ug | ORAL_TABLET | Freq: Every day | ORAL | Status: DC
  Administered 2011-10-16 – 2011-10-17 (×2): 88 ug via ORAL
  Filled 2011-10-15 (×2): qty 1

## 2011-10-15 MED ORDER — METHOCARBAMOL 500 MG PO TABS
500.0000 mg | ORAL_TABLET | Freq: Four times a day (QID) | ORAL | Status: DC | PRN
  Administered 2011-10-16 – 2011-10-17 (×4): 500 mg via ORAL
  Filled 2011-10-15 (×5): qty 1

## 2011-10-15 MED ORDER — FENTANYL CITRATE 0.05 MG/ML IJ SOLN
100.0000 ug | Freq: Once | INTRAMUSCULAR | Status: AC
  Administered 2011-10-15: 100 ug via INTRAVENOUS
  Filled 2011-10-15: qty 2

## 2011-10-15 MED ORDER — IOHEXOL 300 MG/ML  SOLN
100.0000 mL | Freq: Once | INTRAMUSCULAR | Status: AC | PRN
  Administered 2011-10-15: 100 mL via INTRAVENOUS

## 2011-10-15 MED ORDER — MIDAZOLAM HCL 2 MG/2ML IJ SOLN
4.0000 mg | Freq: Once | INTRAMUSCULAR | Status: AC
  Administered 2011-10-15: 4 mg via INTRAVENOUS

## 2011-10-15 MED ORDER — MIDAZOLAM HCL 5 MG/5ML IJ SOLN
INTRAMUSCULAR | Status: DC | PRN
  Administered 2011-10-15: 1 mg via INTRAVENOUS

## 2011-10-15 MED ORDER — PANTOPRAZOLE SODIUM 20 MG PO TBEC
20.0000 mg | DELAYED_RELEASE_TABLET | Freq: Every day | ORAL | Status: DC
  Administered 2011-10-16 (×2): 20 mg via ORAL
  Filled 2011-10-15 (×3): qty 1

## 2011-10-15 MED ORDER — LACTATED RINGERS IV SOLN
INTRAVENOUS | Status: DC | PRN
  Administered 2011-10-15 (×2): via INTRAVENOUS

## 2011-10-15 MED ORDER — ACETAMINOPHEN 10 MG/ML IV SOLN
INTRAVENOUS | Status: DC | PRN
  Administered 2011-10-15: 1000 mg via INTRAVENOUS

## 2011-10-15 MED ORDER — ONDANSETRON HCL 4 MG/2ML IJ SOLN
INTRAMUSCULAR | Status: DC | PRN
  Administered 2011-10-15: 4 mg via INTRAVENOUS

## 2011-10-15 MED ORDER — SODIUM CHLORIDE 0.9 % IV SOLN
INTRAVENOUS | Status: DC
  Administered 2011-10-16 (×2): via INTRAVENOUS

## 2011-10-15 MED ORDER — HYDROMORPHONE HCL PF 1 MG/ML IJ SOLN
INTRAMUSCULAR | Status: DC | PRN
  Administered 2011-10-15: .6 mg via INTRAVENOUS
  Administered 2011-10-15: .4 mg via INTRAVENOUS
  Administered 2011-10-15: .6 mg via INTRAVENOUS
  Administered 2011-10-15: .4 mg via INTRAVENOUS

## 2011-10-15 MED ORDER — METOCLOPRAMIDE HCL 10 MG PO TABS: 5.0000 mg | ORAL_TABLET | Freq: Three times a day (TID) | ORAL | Status: DC | PRN

## 2011-10-15 MED ORDER — METOCLOPRAMIDE HCL 5 MG/ML IJ SOLN: 5.0000 mg | Freq: Three times a day (TID) | INTRAMUSCULAR | Status: DC | PRN

## 2011-10-15 MED ORDER — PROMETHAZINE HCL 25 MG/ML IJ SOLN: 6.2500 mg | INTRAMUSCULAR | Status: DC | PRN

## 2011-10-15 MED ORDER — KETOROLAC TROMETHAMINE 15 MG/ML IJ SOLN
15.0000 mg | Freq: Four times a day (QID) | INTRAMUSCULAR | Status: DC
  Administered 2011-10-16 (×4): 15 mg via INTRAVENOUS
  Filled 2011-10-15 (×10): qty 1

## 2011-10-15 MED ORDER — ONDANSETRON HCL 4 MG/2ML IJ SOLN
INTRAMUSCULAR | Status: AC
  Administered 2011-10-15: 4 mg via INTRAVENOUS
  Filled 2011-10-15: qty 2

## 2011-10-15 MED ORDER — LIDOCAINE HCL (CARDIAC) 20 MG/ML IV SOLN
INTRAVENOUS | Status: DC | PRN
  Administered 2011-10-15: 100 mg via INTRAVENOUS

## 2011-10-15 MED ORDER — CEFAZOLIN SODIUM 1-5 GM-% IV SOLN
INTRAVENOUS | Status: DC | PRN
  Administered 2011-10-15: 2 g via INTRAVENOUS

## 2011-10-15 MED ORDER — FENTANYL CITRATE 0.05 MG/ML IJ SOLN
INTRAMUSCULAR | Status: AC
  Filled 2011-10-15: qty 2

## 2011-10-15 MED ORDER — CISATRACURIUM BESYLATE 2 MG/ML IV SOLN
INTRAVENOUS | Status: DC | PRN
  Administered 2011-10-15: 5 mg via INTRAVENOUS

## 2011-10-15 MED ORDER — MIDAZOLAM HCL 2 MG/2ML IJ SOLN
INTRAMUSCULAR | Status: AC
  Filled 2011-10-15: qty 4

## 2011-10-15 MED ORDER — ONDANSETRON HCL 4 MG PO TABS: 4.0000 mg | ORAL_TABLET | Freq: Four times a day (QID) | ORAL | Status: DC | PRN

## 2011-10-15 SURGICAL SUPPLY — 56 items
BAG ZIPLOCK 12X15 (MISCELLANEOUS) ×2 IMPLANT
BANDAGE GAUZE ELAST BULKY 4 IN (GAUZE/BANDAGES/DRESSINGS) ×2 IMPLANT
BIT DRILL 2.5X2.75 QC CALB (BIT) ×2 IMPLANT
BIT DRILL 2.9 CANN QC NONSTRL (BIT) ×2 IMPLANT
CHLORAPREP W/TINT 26ML (MISCELLANEOUS) IMPLANT
CLOTH BEACON ORANGE TIMEOUT ST (SAFETY) ×2 IMPLANT
CUFF TOURN SGL QUICK 34 (TOURNIQUET CUFF) ×2
CUFF TRNQT CYL 34X4X40X1 (TOURNIQUET CUFF) ×1 IMPLANT
DRAPE C-ARM 42X72 X-RAY (DRAPES) ×2 IMPLANT
DRAPE POUCH INSTRU U-SHP 10X18 (DRAPES) ×2 IMPLANT
DRAPE U-SHAPE 47X51 STRL (DRAPES) ×2 IMPLANT
DRSG ADAPTIC 3X8 NADH LF (GAUZE/BANDAGES/DRESSINGS) ×2 IMPLANT
DRSG PAD ABDOMINAL 8X10 ST (GAUZE/BANDAGES/DRESSINGS) ×2 IMPLANT
DURAPREP 26ML APPLICATOR (WOUND CARE) ×2 IMPLANT
ELECT REM PT RETURN 9FT ADLT (ELECTROSURGICAL) ×2
ELECTRODE REM PT RTRN 9FT ADLT (ELECTROSURGICAL) ×1 IMPLANT
FIXATION ZIPTIGHT ANKLE SNDSMS (Ankle) ×1 IMPLANT
GAUZE KERLIX 2  STERILE LF (GAUZE/BANDAGES/DRESSINGS) IMPLANT
GAUZE PETROLATUM 1 X8 (GAUZE/BANDAGES/DRESSINGS) ×2 IMPLANT
GLOVE BIO SURGEON STRL SZ 6.5 (GLOVE) ×2 IMPLANT
GLOVE BIOGEL PI IND STRL 6.5 (GLOVE) ×1 IMPLANT
GLOVE BIOGEL PI IND STRL 8 (GLOVE) ×1 IMPLANT
GLOVE BIOGEL PI INDICATOR 6.5 (GLOVE) ×1
GLOVE BIOGEL PI INDICATOR 8 (GLOVE) ×1
GLOVE ECLIPSE 6.5 STRL STRAW (GLOVE) ×2 IMPLANT
GLOVE SURG SS PI 8.0 STRL IVOR (GLOVE) ×4 IMPLANT
K-WIRE ACE 1.6X6 (WIRE) ×8
KIT BASIN OR (CUSTOM PROCEDURE TRAY) ×2 IMPLANT
KWIRE ACE 1.6X6 (WIRE) ×4 IMPLANT
MANIFOLD NEPTUNE II (INSTRUMENTS) ×2 IMPLANT
NEEDLE HYPO 22GX1.5 SAFETY (NEEDLE) IMPLANT
PACK LOWER EXTREMITY WL (CUSTOM PROCEDURE TRAY) ×2 IMPLANT
PAD CAST 4YDX4 CTTN HI CHSV (CAST SUPPLIES) ×2 IMPLANT
PAD OB MATERNITY 4.3X12.25 (PERSONAL CARE ITEMS) ×2 IMPLANT
PADDING CAST COTTON 4X4 STRL (CAST SUPPLIES) ×2
PADDING WEBRIL 4 STERILE (GAUZE/BANDAGES/DRESSINGS) ×4 IMPLANT
PLATE ACE 100DEG 8HOLE (Plate) ×2 IMPLANT
POSITIONER SURGICAL ARM (MISCELLANEOUS) ×2 IMPLANT
SCREW ACE CAN 4.0 40M (Screw) ×4 IMPLANT
SCREW CORTICAL 3.5MM  16MM (Screw) ×2 IMPLANT
SCREW CORTICAL 3.5MM 14MM (Screw) IMPLANT
SCREW CORTICAL 3.5MM 16MM (Screw) ×2 IMPLANT
SCREW CORTICAL 3.5MM 18MM (Screw) ×2 IMPLANT
SPLINT FIBERGLASS 4X15 (CAST SUPPLIES) ×2 IMPLANT
SPONGE GAUZE 4X4 12PLY (GAUZE/BANDAGES/DRESSINGS) ×4 IMPLANT
STAPLER SKIN PROX WIDE 3.9 (STAPLE) ×2 IMPLANT
SUCTION FRAZIER 12FR DISP (SUCTIONS) ×2 IMPLANT
SUT ETHILON 4 0 PS 2 18 (SUTURE) ×4 IMPLANT
SUT VIC AB 0 CT1 36 (SUTURE) ×2 IMPLANT
SUT VIC AB 1 CT1 27 (SUTURE)
SUT VIC AB 1 CT1 27XBRD ANTBC (SUTURE) IMPLANT
SUT VIC AB 2-0 CT1 27 (SUTURE) ×2
SUT VIC AB 2-0 CT1 TAPERPNT 27 (SUTURE) ×2 IMPLANT
SYR CONTROL 10ML LL (SYRINGE) IMPLANT
TRAY FOLEY CATH 14FRSI W/METER (CATHETERS) ×2 IMPLANT
ZIPTIGHT ANKLE SYNODESMOSS FIX (Ankle) ×2 IMPLANT

## 2011-10-15 NOTE — ED Notes (Signed)
Dr. Manus Gunning at bedside to perform conscious sedation. Ortho at bedside. Pt vitals stable at start of procedure. Pt placed on monitor and nonrebreather. Spouse at bedside.

## 2011-10-15 NOTE — Preoperative (Signed)
Beta Blockers   Reason not to administer Beta Blockers:Not Applicable 
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
## 2011-10-15 NOTE — Transfer of Care (Signed)
Immediate Anesthesia Transfer of Care Note  Patient: Danielle Burnett  Procedure(s) Performed:  OPEN REDUCTION INTERNAL FIXATION (ORIF) ANKLE FRACTURE  Patient Location: PACU  Anesthesia Type: General  Level of Consciousness: awake and oriented  Airway & Oxygen Therapy: Patient Spontanous Breathing and Patient connected to face mask oxygen  Post-op Assessment: Report given to PACU RN and Post -op Vital signs reviewed and stable  Post vital signs: Reviewed and stable  Complications: No apparent anesthesia complications

## 2011-10-15 NOTE — Progress Notes (Signed)
Cast bivalved and ace applied per jon with ortho
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
## 2011-10-15 NOTE — Brief Op Note (Signed)
10/15/2011  8:35 PM  PATIENT:  Danielle Burnett  41 y.o. female  PRE-OPERATIVE DIAGNOSIS:  fractured right ankle  POST-OPERATIVE DIAGNOSIS:  fractured right ankle  PROCEDURE:  Procedure(s): OPEN REDUCTION INTERNAL FIXATION (ORIF) ANKLE FRACTURE  SURGEON:  Surgeon(s): Javier Docker  PHYSICIAN ASSISTANT:   ASSISTANTS: strader   ANESTHESIA:   general  EBL:  Total I/O In: 1000 [I.V.:1000] Out: 200 [Urine:150; Blood:50]  BLOOD ADMINISTERED:none  DRAINS: none   LOCAL MEDICATIONS USED:  NONE  SPECIMEN:  No Specimen  DISPOSITION OF SPECIMEN:  N/A  COUNTS:  YES  TOURNIQUET:   Total Tourniquet Time Documented: Thigh (Right) - 100 minutes  DICTATION: .Other Dictation: Dictation Number  [id number]  PLAN OF CARE: Admit to inpatient   PATIENT DISPOSITION:  PACU - hemodynamically stable.   Delay start of Pharmacological VTE agent (>24hrs) due to surgical blood loss or risk of bleeding:  {YES/NO/NOT APPLICABLE:20182
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
## 2011-10-15 NOTE — Anesthesia Preprocedure Evaluation (Signed)
Anesthesia Evaluation  Patient identified by MRN, date of birth, ID band Patient awake    Reviewed: Allergy & Precautions, H&P , NPO status , Patient's Chart, lab work & pertinent test results  Airway Mallampati: II TM Distance: >3 FB Neck ROM: Full    Dental No notable dental hx.    Pulmonary neg pulmonary ROS,  clear to auscultation  Pulmonary exam normal       Cardiovascular neg cardio ROS Regular Normal    Neuro/Psych Negative Neurological ROS  Negative Psych ROS   GI/Hepatic negative GI ROS, Neg liver ROS,   Endo/Other  Negative Endocrine ROSHypothyroidism   Renal/GU negative Renal ROS  Genitourinary negative   Musculoskeletal negative musculoskeletal ROS (+)   Abdominal   Peds negative pediatric ROS (+)  Hematology negative hematology ROS (+)   Anesthesia Other Findings   Reproductive/Obstetrics negative OB ROS                           Anesthesia Physical Anesthesia Plan  ASA: II and Emergent  Anesthesia Plan: General   Post-op Pain Management:    Induction: Intravenous  Airway Management Planned: Oral ETT  Additional Equipment:   Intra-op Plan:   Post-operative Plan: Extubation in OR  Informed Consent: I have reviewed the patients History and Physical, chart, labs and discussed the procedure including the risks, benefits and alternatives for the proposed anesthesia with the patient or authorized representative who has indicated his/her understanding and acceptance.   Dental advisory given  Plan Discussed with: CRNA  Anesthesia Plan Comments:         Anesthesia Quick Evaluation
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
## 2011-10-15 NOTE — ED Notes (Signed)
Trauma MD paged per Dr. Manus Gunning for Dr. Jillyn Hidden

## 2011-10-15 NOTE — Anesthesia Postprocedure Evaluation (Signed)
  Anesthesia Post-op Note  Patient: Danielle Burnett  Procedure(s) Performed:  OPEN REDUCTION INTERNAL FIXATION (ORIF) ANKLE FRACTURE  Patient Location: PACU  Anesthesia Type: General  Level of Consciousness: awake and alert   Airway and Oxygen Therapy: Patient Spontanous Breathing  Post-op Pain: mild  Post-op Assessment: Post-op Vital signs reviewed, Patient's Cardiovascular Status Stable, Respiratory Function Stable, Patent Airway and No signs of Nausea or vomiting  Post-op Vital Signs: stable  Complications: No apparent anesthesia complications

## 2011-10-15 NOTE — Anesthesia Procedure Notes (Signed)
Procedure Name: Intubation Date/Time: 10/15/2011 6:15 PM Performed by: Lurlean Leyden, Atsushi Yom L. Patient Re-evaluated:Patient Re-evaluated prior to inductionOxygen Delivery Method: Circle System Utilized Preoxygenation: Pre-oxygenation with 100% oxygen Intubation Type: IV induction, Rapid sequence and Cricoid Pressure applied Laryngoscope Size: Miller and 2 Grade View: Grade I Tube type: Oral Tube size: 7.5 mm Number of attempts: 1 Airway Equipment and Method: stylet Placement Confirmation: ETT inserted through vocal cords under direct vision,  breath sounds checked- equal and bilateral and positive ETCO2 Secured at: 21 cm Tube secured with: Tape Dental Injury: Teeth and Oropharynx as per pre-operative assessment
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
## 2011-10-16 MED ORDER — ASPIRIN 325 MG PO TABS: 325.0000 mg | ORAL_TABLET | Freq: Two times a day (BID) | ORAL | Status: DC

## 2011-10-16 MED ORDER — HYDROCODONE-ACETAMINOPHEN 5-325 MG PO TABS: 1.0000 | ORAL_TABLET | ORAL | Status: DC | PRN

## 2011-10-16 MED ORDER — POTASSIUM CHLORIDE CRYS ER 20 MEQ PO TBCR
20.0000 meq | EXTENDED_RELEASE_TABLET | Freq: Two times a day (BID) | ORAL | Status: DC
  Administered 2011-10-16 – 2011-10-17 (×3): 20 meq via ORAL
  Filled 2011-10-16 (×4): qty 1

## 2011-10-16 MED ORDER — METHOCARBAMOL 500 MG PO TABS: 500.0000 mg | ORAL_TABLET | Freq: Four times a day (QID) | ORAL | Status: DC | PRN

## 2011-10-16 NOTE — Progress Notes (Signed)
Subjective: 1 Day Post-Op Procedure(s) (LRB): OPEN REDUCTION INTERNAL FIXATION (ORIF) ANKLE FRACTURE (Right) Patient reports pain as 3 on 0-10 scale.   Denies CP or SOB.  Voiding without difficulty. Positive flatus. Objective: Vital signs in last 24 hours: Temp:  [97.4 F (36.3 C)-98.8 F (37.1 C)] 98 F (36.7 C) (01/04 0615) Pulse Rate:  [73-96] 73  (01/04 0615) Resp:  [10-20] 14  (01/04 0615) BP: (110-172)/(62-114) 110/68 mmHg (01/04 0615) SpO2:  [98 %-100 %] 100 % (01/04 0615) Weight:  [83.915 kg (185 lb)-84 kg (185 lb 3 oz)] 185 lb 3 oz (84 kg) (01/03 2228)  Intake/Output from previous day: 01/03 0701 - 01/04 0700 In: 2766.7 [I.V.:2666.7; IV Piggyback:100] Out: 3100 [Urine:3050; Blood:50] Intake/Output this shift:     Basename 10/15/11 1317  HGB 12.6    Basename 10/15/11 1317  WBC --  RBC --  HCT 37.0  PLT --    Basename 10/15/11 1317  NA 140  K 3.2*  CL 105  CO2 --  BUN 8  CREATININE 0.70  GLUCOSE 103*  CALCIUM --   No results found for this basename: LABPT:2,INR:2 in the last 72 hours  Neurologically intact Sensation intact distally cast clean,dry,intact Bivalved Good cap refill  Assessment/Plan: 1 Day Post-Op Procedure(s) (LRB): OPEN REDUCTION INTERNAL FIXATION (ORIF) ANKLE FRACTURE (Right) Plan for discharge tomorrow Will start ASA today for DVT prevention Up with PT Discussed post operative course with pt.  Nathanyel Defenbaugh R. 10/16/2011, 8:20 AM

## 2011-10-16 NOTE — Progress Notes (Signed)
Physical Therapy Treatment Patient Details Name: Danielle Burnett MRN: [id number] DOB: October 29, 1970 Today's Date: 10/16/2011  4098-1191 G  PT Assessment/Plan  PT - Assessment/Plan Comments on Treatment Session: Pt performed ambulation with crutches and also with LE scooter.  Pt states she feels increased stability with RW, however feels it would be helpful to use scooter at home.  Will continue to use per pt request, also contact case manager to check on insurance coverage for scooter.  PT Plan: Discharge plan remains appropriate PT Frequency: Min 5X/week Follow Up Recommendations: Home health PT Equipment Recommended: Rolling walker with 5" wheels (crutches) PT Goals  Acute Rehab PT Goals PT Goal Formulation: With patient Time For Goal Achievement: 6 days Pt will go Supine/Side to Sit: with supervision PT Goal: Supine/Side to Sit - Progress: Progressing toward goal Pt will go Sit to Supine/Side: with supervision PT Goal: Sit to Supine/Side - Progress: Progressing toward goal Pt will go Sit to Stand: with supervision PT Goal: Sit to Stand - Progress: Updated due to goal met Pt will go Stand to Sit: with supervision PT Goal: Stand to Sit - Progress: Progressing toward goal Pt will Ambulate: 51 - 150 feet;with supervision;with least restrictive assistive device PT Goal: Ambulate - Progress: Progressing toward goal Pt will Go Up / Down Stairs: 1-2 stairs;with min assist;with least restrictive assistive device PT Goal: Up/Down Stairs - Progress: Not met Pt will Perform Home Exercise Program: with supervision, verbal cues required/provided PT Goal: Perform Home Exercise Program - Progress: Not met  PT Treatment Precautions/Restrictions  Restrictions Weight Bearing Restrictions: Yes RLE Weight Bearing: Non weight bearing Mobility (including Balance) Bed Mobility Bed Mobility: Yes Supine to Sit: 4: Min assist;HOB elevated (Comment degrees) Supine to Sit Details (indicate cue type and  reason): HOB elevated approx 30 deg. Cues given for technique when using UE to assist. Required some assist to RLE Sitting - Scoot to Edge of Bed: 4: Min assist Sitting - Scoot to Edge of Bed Details (indicate cue type and reason): Required some assist for RLE.  Transfers Transfers: Yes Sit to Stand: 4: Min assist;From bed;With upper extremity assist;From chair/3-in-1;With armrests Sit to Stand Details (indicate cue type and reason): Requires cues for UE placement on bed and to maintain NWB status.  Stand to Sit: 4: Min assist Stand to Sit Details: Requires min A for safe/controlled descent and cues for hand placement on bed Ambulation/Gait Ambulation/Gait: Yes Ambulation/Gait Assistance: 4: Min assist;3: Mod assist Ambulation/Gait Assistance Details (indicate cue type and reason): Ambulation trial with crutches and LE scooter.  Required Min/Mod A for safety/stability.  Pt states she feels more stable with RW, however, would also like to use scooter due to ambulation distances at home.  Ambulation Distance (Feet): 40 Feet Assistive device: Crutches;Rolling walker (LE scooter) Gait Pattern: Step-to pattern Gait velocity: decreased gait velocity    Exercise    End of Session PT - End of Session Equipment Utilized During Treatment: Gait belt Activity Tolerance: Patient tolerated treatment well Patient left: in bed;with call bell in reach General Behavior During Session: [facility] for tasks performed Cognition: [facility] for tasks performed  Page, Meribeth Mattes 10/16/2011, 4:04 PM
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
## 2011-10-17 MED ORDER — METHOCARBAMOL 500 MG PO TABS: 500.0000 mg | ORAL_TABLET | Freq: Four times a day (QID) | ORAL | Status: AC | PRN

## 2011-10-17 MED ORDER — HYDROCODONE-ACETAMINOPHEN 5-325 MG PO TABS: 1.0000 | ORAL_TABLET | ORAL | Status: AC | PRN

## 2011-10-17 MED ORDER — ASPIRIN 325 MG PO TABS: 325.0000 mg | ORAL_TABLET | Freq: Two times a day (BID) | ORAL | Status: AC
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
## 2011-10-17 NOTE — Progress Notes (Signed)
Cm spoke with pt concerning d/c planning. Per pt request RW and knee scooter. Cm contacted workers [employer] provider [phone] ext [phone], Nadara Eaton. Per rep [employer] to provide DME, with reimbursement provided by workers comp to [facility]. [employer] notified of pt's DME request. Per [employer] knee scooter cost 500.00 which would need pre-approval. Pt notified of equipment cost. Cm to contact workers comp rep. Pt states will accept RW and crutches for home use until guaranteed approval of knee scooter from workers comp.  Leonie Green [phone]

## 2011-10-17 NOTE — Progress Notes (Signed)
Physical Therapy Treatment Patient Details Name: Danielle Burnett MRN: [id number] DOB: 08-Apr-1971 Today's Date: 10/17/2011 1031-1100 2gt PT Assessment/Plan  PT - Assessment/Plan Comments on Treatment Session: pt doing well, still with some pain but tolerating; pt spoke to CM regarding scooter PT Plan: Discharge plan remains appropriate Follow Up Recommendations: Home health PT Equipment Recommended: Rolling walker with 5" wheels PT Goals  Acute Rehab PT Goals PT Goal: Supine/Side to Sit - Progress: Progressing toward goal PT Goal: Sit to Supine/Side - Progress: Progressing toward goal PT Goal: Sit to Stand - Progress: Progressing toward goal PT Goal: Stand to Sit - Progress: Progressing toward goal PT Goal: Ambulate - Progress: Progressing toward goal PT Goal: Up/Down Stairs - Progress: Met  PT Treatment Precautions/Restrictions  Restrictions Weight Bearing Restrictions: Yes RLE Weight Bearing: Non weight bearing Mobility (including Balance) Bed Mobility Sit to Supine - Right: 5: Supervision Sit to Supine - Right Details (indicate cue type and reason): cues for positioning Transfers Sit to Stand: 4: Min assist;From bed;With upper extremity assist;From chair/3-in-1;With armrests (min/guard) Sit to Stand Details (indicate cue type and reason): Requires cues for UE placement on bed and to maintain NWB status.  Stand to Sit: 4: Min assist Stand to Sit Details: Requires min A for safe/controlled descent and cues for hand placement on bed Ambulation/Gait Ambulation/Gait Assistance: 4: Min assist;5: Supervision Ambulation/Gait Assistance Details (indicate cue type and reason): cues for sequence and NWB; pt compliant with NWB Ambulation Distance (Feet): 65 Feet Assistive device: Rolling walker Stairs: Yes Stairs Assistance: 4: Min assist Stairs Assistance Details (indicate cue type and reason): min for balance. cues for sequence and technique Stair Management Technique: One rail  Right;With crutches;Backwards;Forwards;With walker Number of Stairs: 5     Exercise    End of Session PT - End of Session Equipment Utilized During Treatment: Gait belt Activity Tolerance: Patient tolerated treatment well Patient left: in bed;with call bell in reach General Behavior During Session: [facility] for tasks performed Cognition: [facility] for tasks performed  [facility] 10/17/2011, 11:28 AM

## 2011-10-17 NOTE — Progress Notes (Signed)
Discharge note for patient. Patient alert and oriented and is the learner. Pt is stable at this time. Physical Therapy complete for the day and CM has seen pt to set up home Health after RN contacted Dr. Victorino Dike and got a telephone order for it. Pt given discharge instructions and verbalized understanding. Pt waiting on the DME to come and then will be discharged to home with her spouse. Pt has the 2 prescriptions for pain and muscle relaxer meds.  Sheran Luz RN BSN

## 2011-10-17 NOTE — Plan of Care (Signed)
Problem: Phase II Progression Outcomes Goal: Tolerating diet Outcome: Progressing Patient is tolerating diet without any nauseous

## 2011-10-17 NOTE — Plan of Care (Signed)
Problem: Phase III Progression Outcomes Goal: IV changed to normal saline lock Outcome: Progressing IV site in left AC started leaking and bleeding, called MD on call to ask if he wanted me to have another IV site inserted as patient is going to be discharged to home in am.  New order given to DC fluids, IV site and toradol as pain is controlled.

## 2011-10-17 NOTE — Progress Notes (Signed)
Subjective: 2 Days Post-Op Procedure(s) (LRB): OPEN REDUCTION INTERNAL FIXATION (ORIF) ANKLE FRACTURE (Right) Patient reports pain as mild.   Patient seen in rounds with Dr. Lequita Halt. Patient has complaints of some pain but controlled. Ready and wants to go home today.  Objective: Vital signs in last 24 hours: Temp:  [98.2 F (36.8 C)-98.5 F (36.9 C)] 98.2 F (36.8 C) (01/05 0601) Pulse Rate:  [59-80] 59  (01/05 0601) Resp:  [16] 16  (01/05 0601) BP: (125-131)/(77-81) 125/78 mmHg (01/05 0601) SpO2:  [98 %-100 %] 98 % (01/05 0601)  Intake/Output from previous day:  Intake/Output Summary (Last 24 hours) at 10/17/11 0818 Last data filed at 10/17/11 0602  Gross per 24 hour  Intake 2571.67 ml  Output   1950 ml  Net 621.67 ml    Intake/Output this shift:    Labs: Results for orders placed during the hospital encounter of 10/15/11  POCT I-STAT, CHEM 8      Component Value Range   Sodium 140  135 - 145 (mEq/L)   Potassium 3.2 (*) 3.5 - 5.1 (mEq/L)   Chloride 105  96 - 112 (mEq/L)   BUN 8  6 - 23 (mg/dL)   Creatinine, Ser 1.61  0.50 - 1.10 (mg/dL)   Glucose, Bld 096 (*) 70 - 99 (mg/dL)   Calcium, Ion 0.45  4.09 - 1.32 (mmol/L)   TCO2 23  0 - 100 (mmol/L)   Hemoglobin 12.6  12.0 - 15.0 (g/dL)   HCT 81.1  91.4 - 78.2 (%)    Exam: Neurovascular intact Sensation intact distally Splint in place Motor function intact - moving toes well on exam.   Assessment/Plan: 2 Days Post-Op Procedure(s) (LRB): OPEN REDUCTION INTERNAL FIXATION (ORIF) ANKLE FRACTURE (Right) Up with therapy Discharge home with home health Procedure(s) (LRB): OPEN REDUCTION INTERNAL FIXATION (ORIF) ANKLE FRACTURE (Right) Past Medical History  Diagnosis Date  . Hypothyroid     Diet - regular Follow up - in 2 weeks Activity - NWB Condition Upon Discharge - Good D/C Meds - Norco, Robaxin DVT Prophylaxis - Aspirin   Yuchen Fedor 10/17/2011, 8:18 AM

## 2011-10-17 NOTE — Plan of Care (Signed)
Problem: Phase II Progression Outcomes Goal: Dressings dry/intact Outcome: Progressing Right ankle dressing is cdi, toes are cool to touch with good circulation, am able to move toes.

## 2011-10-17 NOTE — Plan of Care (Signed)
Problem: Phase II Progression Outcomes Goal: Pain controlled Outcome: Progressing Patient's pain level is controlled, appears to be resting, eyes closed, resp, even and unlabored    Goal: Progress activity as tolerated unless otherwise ordered Outcome: Progressing Up with assist with crutches, NWB

## 2011-10-17 NOTE — Plan of Care (Signed)
Problem: Phase II Progression Outcomes Goal: Foley discontinued Outcome: Progressing Patient is up with assist to void without difficutly

## 2011-10-17 NOTE — Plan of Care (Signed)
Problem: Phase II Progression Outcomes Goal: Progressing with IS, TCDB Outcome: Progressing Encouraged to use IS, [facility]
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
## 2011-10-26 ENCOUNTER — Encounter (HOSPITAL_COMMUNITY): Payer: Self-pay | Admitting: Specialist

## 2011-10-26 ENCOUNTER — Encounter (HOSPITAL_COMMUNITY)
Admission: RE | Admit: 2011-10-26 | Discharge: 2011-10-26 | Disposition: A | Discharge status: Home / Self Care | Payer: BC Managed Care – PPO | Source: Ambulatory Visit | Attending: Obstetrics and Gynecology | Admitting: Obstetrics and Gynecology

## 2011-10-26 DIAGNOSIS — O923 Agalactia: Secondary | ICD-10-CM | POA: Insufficient documentation

## 2011-11-26 ENCOUNTER — Encounter (HOSPITAL_COMMUNITY)
Admission: RE | Admit: 2011-11-26 | Discharge: 2011-11-26 | Disposition: A | Discharge status: Home / Self Care | Payer: BC Managed Care – PPO | Source: Ambulatory Visit | Attending: Obstetrics and Gynecology | Admitting: Obstetrics and Gynecology

## 2011-11-26 DIAGNOSIS — O923 Agalactia: Secondary | ICD-10-CM | POA: Insufficient documentation

## 2011-12-25 ENCOUNTER — Encounter (HOSPITAL_COMMUNITY)
Admission: RE | Admit: 2011-12-25 | Discharge: 2011-12-25 | Disposition: A | Discharge status: Home / Self Care | Payer: BC Managed Care – PPO | Source: Ambulatory Visit | Attending: Obstetrics and Gynecology | Admitting: Obstetrics and Gynecology

## 2011-12-25 DIAGNOSIS — O923 Agalactia: Secondary | ICD-10-CM | POA: Insufficient documentation

## 2012-01-25 ENCOUNTER — Encounter (HOSPITAL_COMMUNITY)
Admission: RE | Admit: 2012-01-25 | Discharge: 2012-01-25 | Disposition: A | Discharge status: Home / Self Care | Payer: BC Managed Care – PPO | Source: Ambulatory Visit | Attending: Obstetrics and Gynecology | Admitting: Obstetrics and Gynecology

## 2012-01-25 DIAGNOSIS — O923 Agalactia: Secondary | ICD-10-CM | POA: Insufficient documentation

## 2014-05-02 ENCOUNTER — Other Ambulatory Visit: Payer: Self-pay | Admitting: Obstetrics and Gynecology

## 2014-06-04 ENCOUNTER — Other Ambulatory Visit: Payer: Self-pay | Admitting: Dermatology

## 2014-11-08 ENCOUNTER — Other Ambulatory Visit: Payer: Self-pay | Admitting: Obstetrics and Gynecology

## 2014-11-09 LAB — CYTOLOGY - PAP

## 2015-05-10 ENCOUNTER — Other Ambulatory Visit: Payer: Self-pay | Admitting: Obstetrics and Gynecology

## 2015-05-13 LAB — CYTOLOGY - PAP

## 2016-06-02 ENCOUNTER — Encounter: Payer: Self-pay | Admitting: Internal Medicine

## 2016-11-09 DIAGNOSIS — J029 Acute pharyngitis, unspecified: Secondary | ICD-10-CM | POA: Diagnosis not present

## 2016-11-09 DIAGNOSIS — J209 Acute bronchitis, unspecified: Secondary | ICD-10-CM | POA: Diagnosis not present

## 2016-12-17 DIAGNOSIS — Z23 Encounter for immunization: Secondary | ICD-10-CM | POA: Diagnosis not present

## 2016-12-29 DIAGNOSIS — E038 Other specified hypothyroidism: Secondary | ICD-10-CM | POA: Diagnosis not present

## 2016-12-29 DIAGNOSIS — E784 Other hyperlipidemia: Secondary | ICD-10-CM | POA: Diagnosis not present

## 2016-12-29 DIAGNOSIS — I10 Essential (primary) hypertension: Secondary | ICD-10-CM | POA: Diagnosis not present

## 2017-01-05 DIAGNOSIS — D352 Benign neoplasm of pituitary gland: Secondary | ICD-10-CM | POA: Diagnosis not present

## 2017-01-05 DIAGNOSIS — J45909 Unspecified asthma, uncomplicated: Secondary | ICD-10-CM | POA: Diagnosis not present

## 2017-01-05 DIAGNOSIS — Z8249 Family history of ischemic heart disease and other diseases of the circulatory system: Secondary | ICD-10-CM | POA: Diagnosis not present

## 2017-01-05 DIAGNOSIS — J302 Other seasonal allergic rhinitis: Secondary | ICD-10-CM | POA: Diagnosis not present

## 2017-01-05 DIAGNOSIS — Z0000 Encounter for general adult medical examination without abnormal findings: Secondary | ICD-10-CM | POA: Diagnosis not present

## 2017-01-05 DIAGNOSIS — J189 Pneumonia, unspecified organism: Secondary | ICD-10-CM | POA: Diagnosis not present

## 2017-01-07 DIAGNOSIS — M79675 Pain in left toe(s): Secondary | ICD-10-CM | POA: Diagnosis not present

## 2017-01-07 DIAGNOSIS — M2041 Other hammer toe(s) (acquired), right foot: Secondary | ICD-10-CM | POA: Diagnosis not present

## 2017-01-07 DIAGNOSIS — M205X2 Other deformities of toe(s) (acquired), left foot: Secondary | ICD-10-CM | POA: Diagnosis not present

## 2017-01-07 DIAGNOSIS — M2042 Other hammer toe(s) (acquired), left foot: Secondary | ICD-10-CM | POA: Diagnosis not present

## 2017-04-12 DIAGNOSIS — R300 Dysuria: Secondary | ICD-10-CM | POA: Diagnosis not present

## 2017-06-22 ENCOUNTER — Encounter: Payer: Self-pay | Admitting: Internal Medicine

## 2017-07-06 DIAGNOSIS — I10 Essential (primary) hypertension: Secondary | ICD-10-CM | POA: Diagnosis not present

## 2017-07-06 DIAGNOSIS — E038 Other specified hypothyroidism: Secondary | ICD-10-CM | POA: Diagnosis not present

## 2017-07-06 DIAGNOSIS — D352 Benign neoplasm of pituitary gland: Secondary | ICD-10-CM | POA: Diagnosis not present

## 2017-07-12 DIAGNOSIS — I10 Essential (primary) hypertension: Secondary | ICD-10-CM | POA: Diagnosis not present

## 2017-07-12 DIAGNOSIS — Z23 Encounter for immunization: Secondary | ICD-10-CM | POA: Diagnosis not present

## 2017-07-12 DIAGNOSIS — E7849 Other hyperlipidemia: Secondary | ICD-10-CM | POA: Diagnosis not present

## 2017-07-12 DIAGNOSIS — E038 Other specified hypothyroidism: Secondary | ICD-10-CM | POA: Diagnosis not present

## 2017-07-12 DIAGNOSIS — D352 Benign neoplasm of pituitary gland: Secondary | ICD-10-CM | POA: Diagnosis not present

## 2017-07-12 DIAGNOSIS — Z1389 Encounter for screening for other disorder: Secondary | ICD-10-CM | POA: Diagnosis not present

## 2017-08-16 ENCOUNTER — Ambulatory Visit: Payer: Self-pay | Admitting: Internal Medicine

## 2017-08-18 ENCOUNTER — Ambulatory Visit (INDEPENDENT_AMBULATORY_CARE_PROVIDER_SITE_OTHER): Payer: BLUE CROSS/BLUE SHIELD | Admitting: Internal Medicine

## 2017-08-18 ENCOUNTER — Encounter (INDEPENDENT_AMBULATORY_CARE_PROVIDER_SITE_OTHER): Payer: Self-pay

## 2017-08-18 ENCOUNTER — Encounter: Payer: Self-pay | Admitting: Internal Medicine

## 2017-08-18 VITALS — BP 100/60 | HR 72 | Ht 63.0 in | Wt 193.0 lb

## 2017-08-18 DIAGNOSIS — K9049 Malabsorption due to intolerance, not elsewhere classified: Secondary | ICD-10-CM

## 2017-08-18 DIAGNOSIS — E669 Obesity, unspecified: Secondary | ICD-10-CM

## 2017-08-18 DIAGNOSIS — R1013 Epigastric pain: Secondary | ICD-10-CM

## 2017-08-18 NOTE — Patient Instructions (Addendum)
  Keep up the good work.   Look up the Diet Dr.online     We will put you in for a colon recall for 09/2020.   I appreciate the opportunity to care for you. Stan Headarl Gessner, MD, [facility]
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
## 2018-03-01 DIAGNOSIS — I10 Essential (primary) hypertension: Secondary | ICD-10-CM | POA: Diagnosis not present

## 2018-03-01 DIAGNOSIS — Z0000 Encounter for general adult medical examination without abnormal findings: Secondary | ICD-10-CM | POA: Diagnosis not present

## 2018-03-01 DIAGNOSIS — E038 Other specified hypothyroidism: Secondary | ICD-10-CM | POA: Diagnosis not present

## 2018-03-11 DIAGNOSIS — I10 Essential (primary) hypertension: Secondary | ICD-10-CM | POA: Diagnosis not present

## 2018-03-11 DIAGNOSIS — Z0000 Encounter for general adult medical examination without abnormal findings: Secondary | ICD-10-CM | POA: Diagnosis not present

## 2018-03-11 DIAGNOSIS — Z8249 Family history of ischemic heart disease and other diseases of the circulatory system: Secondary | ICD-10-CM | POA: Diagnosis not present

## 2018-03-11 DIAGNOSIS — Z1389 Encounter for screening for other disorder: Secondary | ICD-10-CM | POA: Diagnosis not present

## 2018-03-11 DIAGNOSIS — E7849 Other hyperlipidemia: Secondary | ICD-10-CM | POA: Diagnosis not present

## 2018-03-11 DIAGNOSIS — D352 Benign neoplasm of pituitary gland: Secondary | ICD-10-CM | POA: Diagnosis not present

## 2018-03-29 DIAGNOSIS — Z6836 Body mass index (BMI) 36.0-36.9, adult: Secondary | ICD-10-CM | POA: Diagnosis not present

## 2018-03-29 DIAGNOSIS — Z01419 Encounter for gynecological examination (general) (routine) without abnormal findings: Secondary | ICD-10-CM | POA: Diagnosis not present

## 2018-03-29 DIAGNOSIS — Z1231 Encounter for screening mammogram for malignant neoplasm of breast: Secondary | ICD-10-CM | POA: Diagnosis not present

## 2018-09-05 DIAGNOSIS — J45909 Unspecified asthma, uncomplicated: Secondary | ICD-10-CM | POA: Diagnosis not present

## 2018-09-05 DIAGNOSIS — D352 Benign neoplasm of pituitary gland: Secondary | ICD-10-CM | POA: Diagnosis not present

## 2018-09-05 DIAGNOSIS — K219 Gastro-esophageal reflux disease without esophagitis: Secondary | ICD-10-CM | POA: Diagnosis not present

## 2018-09-05 DIAGNOSIS — I10 Essential (primary) hypertension: Secondary | ICD-10-CM | POA: Diagnosis not present

## 2018-09-05 DIAGNOSIS — Z23 Encounter for immunization: Secondary | ICD-10-CM | POA: Diagnosis not present

## 2018-12-09 DIAGNOSIS — K5792 Diverticulitis of intestine, part unspecified, without perforation or abscess without bleeding: Secondary | ICD-10-CM | POA: Diagnosis not present

## 2018-12-09 DIAGNOSIS — R002 Palpitations: Secondary | ICD-10-CM | POA: Diagnosis not present

## 2018-12-09 DIAGNOSIS — N200 Calculus of kidney: Secondary | ICD-10-CM | POA: Diagnosis not present

## 2018-12-09 DIAGNOSIS — R1032 Left lower quadrant pain: Secondary | ICD-10-CM | POA: Diagnosis not present

## 2018-12-20 DIAGNOSIS — T50905A Adverse effect of unspecified drugs, medicaments and biological substances, initial encounter: Secondary | ICD-10-CM | POA: Diagnosis not present

## 2018-12-20 DIAGNOSIS — I10 Essential (primary) hypertension: Secondary | ICD-10-CM | POA: Diagnosis not present

## 2018-12-20 DIAGNOSIS — R1032 Left lower quadrant pain: Secondary | ICD-10-CM | POA: Diagnosis not present

## 2018-12-20 DIAGNOSIS — K5792 Diverticulitis of intestine, part unspecified, without perforation or abscess without bleeding: Secondary | ICD-10-CM | POA: Diagnosis not present

## 2018-12-23 ENCOUNTER — Other Ambulatory Visit: Payer: Self-pay | Admitting: Internal Medicine

## 2018-12-23 DIAGNOSIS — R1032 Left lower quadrant pain: Secondary | ICD-10-CM

## 2018-12-30 ENCOUNTER — Ambulatory Visit
Admission: RE | Admit: 2018-12-30 | Discharge: 2018-12-30 | Disposition: A | Discharge status: Home / Self Care | Payer: BLUE CROSS/BLUE SHIELD | Source: Ambulatory Visit | Attending: Internal Medicine | Admitting: Internal Medicine

## 2018-12-30 DIAGNOSIS — K5730 Diverticulosis of large intestine without perforation or abscess without bleeding: Secondary | ICD-10-CM | POA: Diagnosis not present

## 2018-12-30 DIAGNOSIS — R1032 Left lower quadrant pain: Secondary | ICD-10-CM

## 2018-12-30 IMAGING — CT CT ABDOMEN AND PELVIS WITH CONTRAST
2 of 5 series · 13 of 46 positions shown, 15 images · IV contrast (iopamidol)
Comparison: 10/15/2011

CLINICAL DATA: Left lower quadrant pain. Diverticulitis.

EXAM:
CT ABDOMEN AND PELVIS WITH CONTRAST
TECHNIQUE: Multidetector CT imaging of the abdomen and pelvis was performed
using the standard protocol following bolus administration of
intravenous contrast.
CONTRAST:  100mL E1TIS7-LCC IOPAMIDOL (E1TIS7-LCC) INJECTION 61%

[Series 2: abd pelvis 5.00 br40 s3 ax · axial · 0.68mm/px · z∈[+1236,+1626]mm · 10 of 88 slices shown, 12 images]
[im 5/88  soft-tissue]
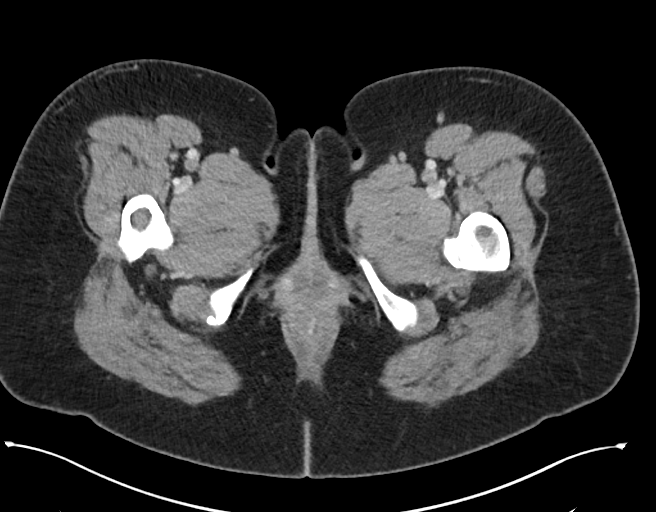
[im 5/88  bone]
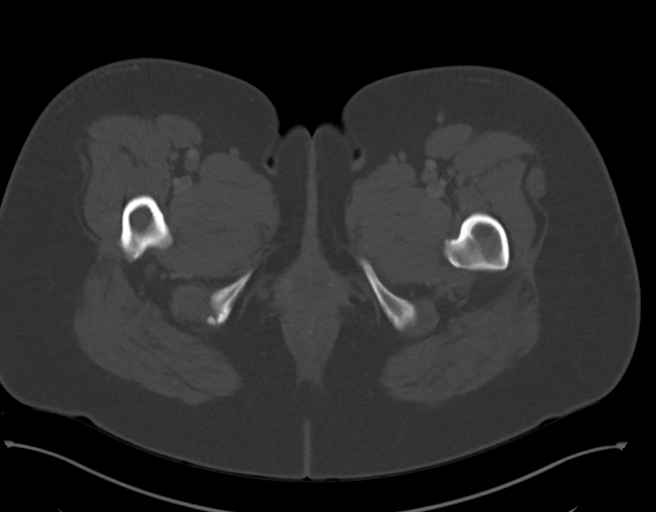
[im 15/88  soft-tissue]
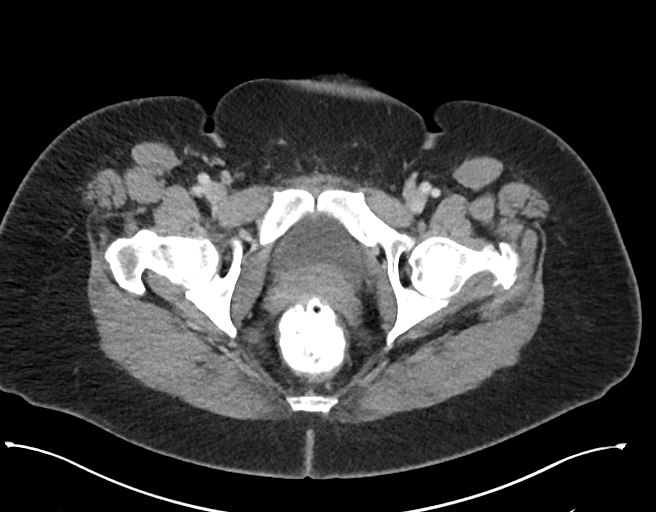
[im 25/88  soft-tissue]
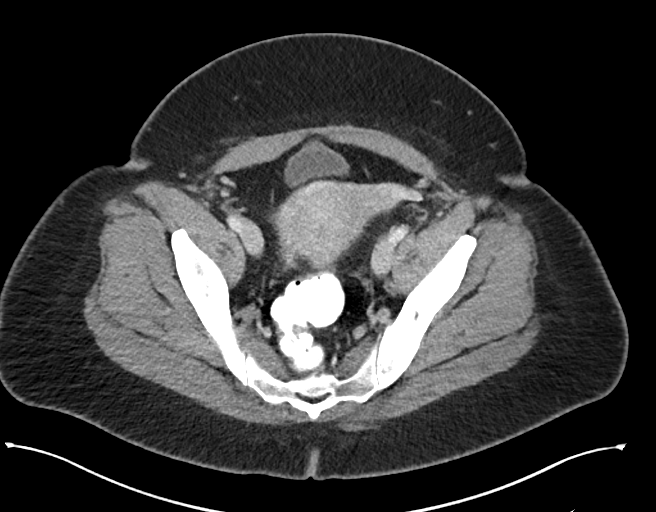
[im 30/88  soft-tissue]
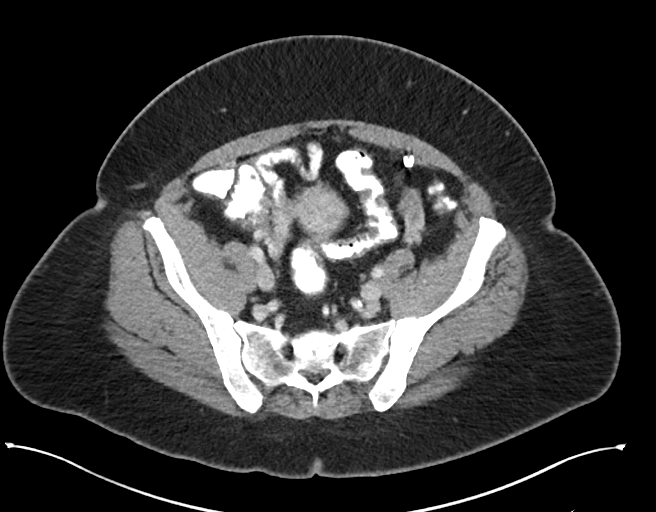
[im 39/88  soft-tissue]
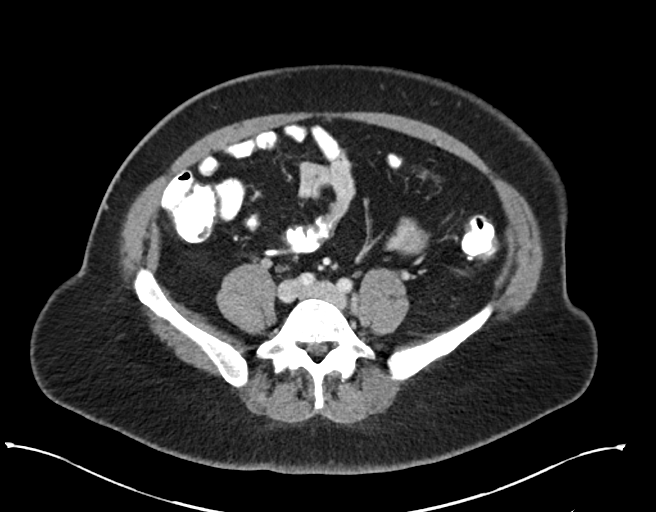
[im 49/88  soft-tissue]
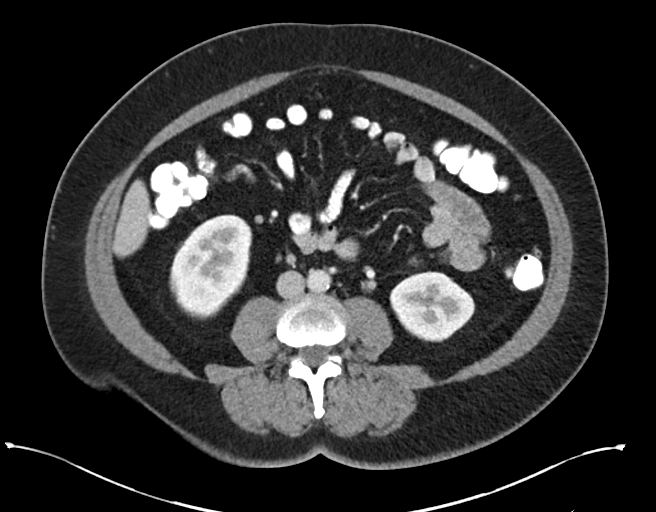
[im 59/88  soft-tissue]
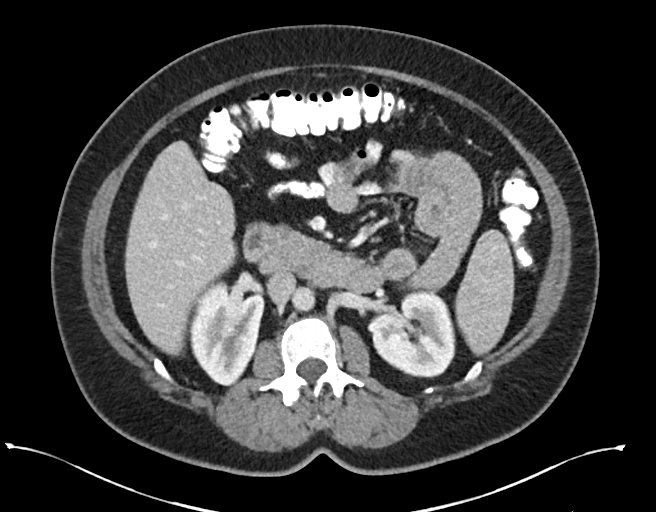
[im 63/88  soft-tissue]
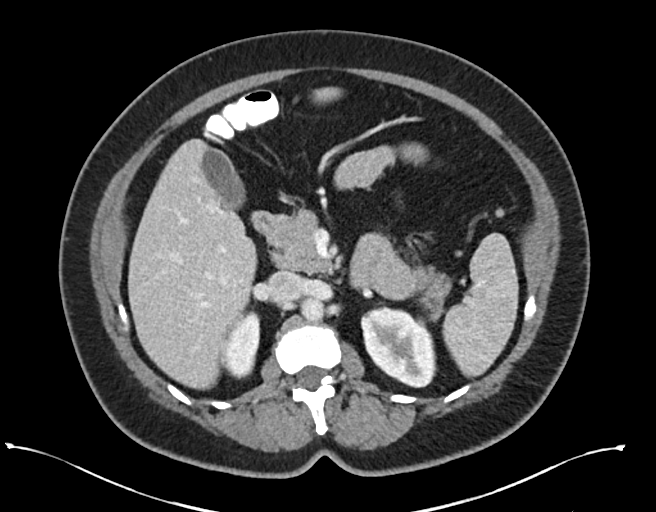
[im 73/88  soft-tissue]
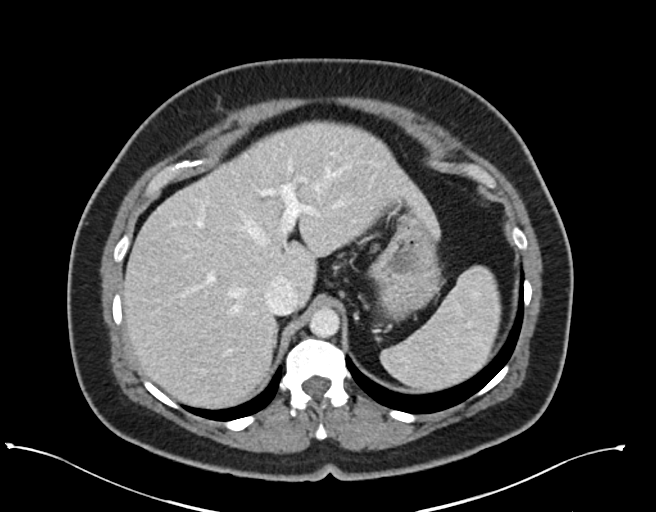
[im 73/88  bone]
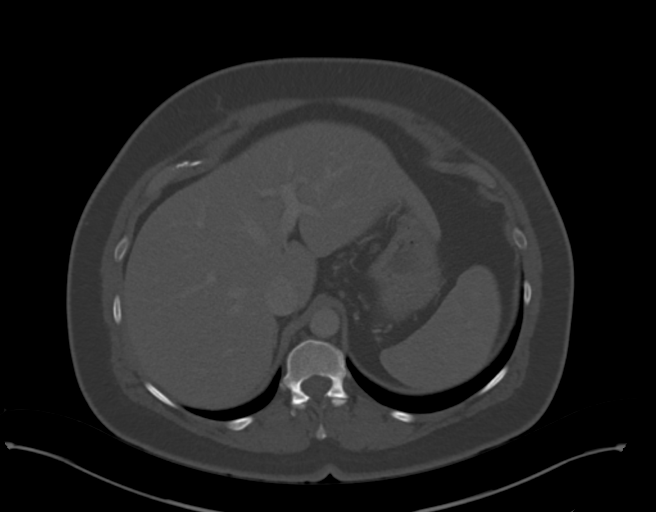
[im 83/88  soft-tissue]
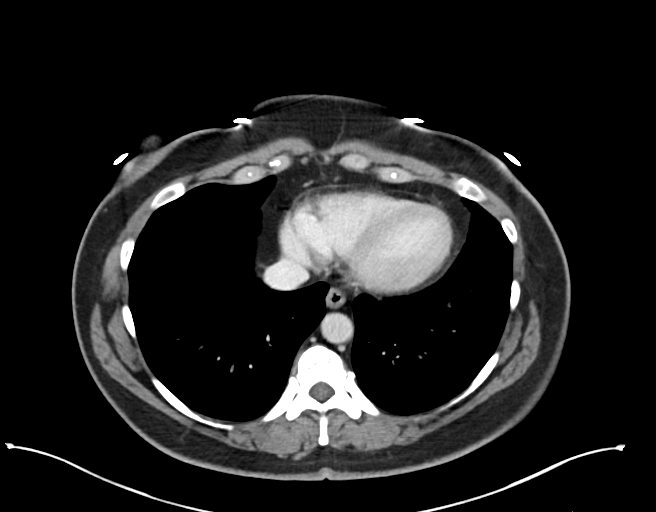

[Series 6: abd pelvis 2.00 br40 s3 cor · coronal · 0.87mm/px · 3 of 175 slices shown]
[im 59/175  soft-tissue]
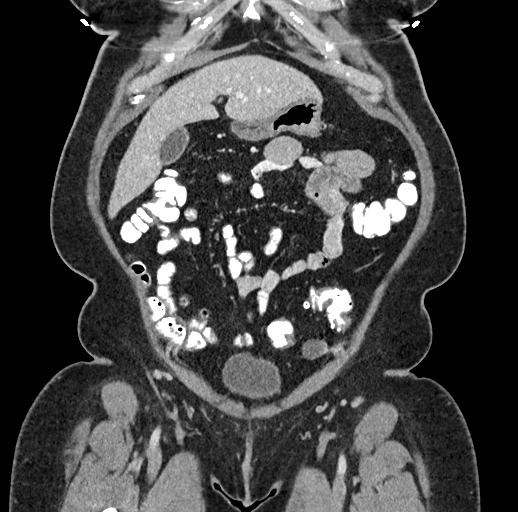
[im 78/175  soft-tissue]
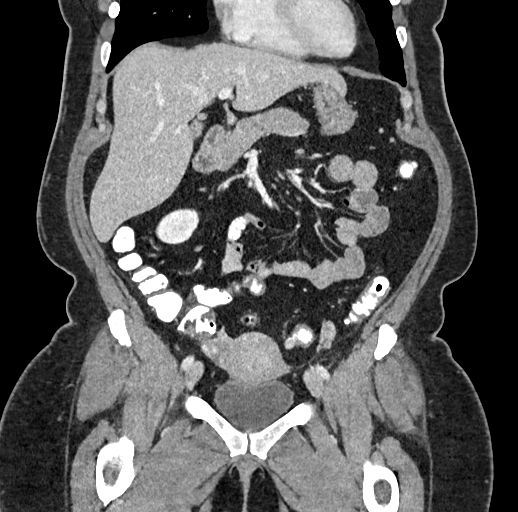
[im 97/175  soft-tissue]
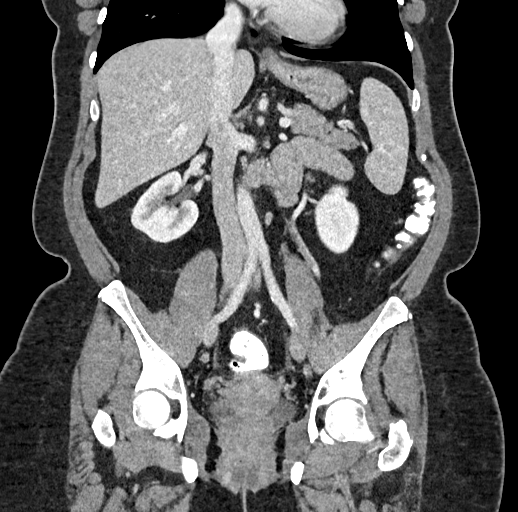

[13 of 46 positions shown; findings below may reference images not displayed]

FINDINGS: Lower chest: The visualized lung bases are clear.

Hepatobiliary: No focal liver abnormality is seen. No gallstones,
gallbladder wall thickening, or biliary dilatation.

Pancreas: Unremarkable.

Spleen: Unremarkable.

Adrenals/Urinary Tract: Unremarkable adrenal glands. No evidence of
renal mass, calculi, or hydronephrosis. Unremarkable bladder.

Stomach/Bowel: The stomach is largely collapsed. There is no
evidence of bowel obstruction. Left-sided colonic diverticulosis is
noted, and there is minimal stranding adjacent to the mid to distal
descending colon without extraluminal gas or fluid collection. The
appendix is unremarkable.

Vascular/Lymphatic: Mild abdominal aortic atherosclerosis without
aneurysm. No enlarged or suspicious lymph nodes. Scattered
subcentimeter retroperitoneal lymph nodes are similar to the prior
study.

Reproductive: Unremarkable appearance of the uterus and right ovary.
Prior tubal ligation. 2.2 cm low-density region in the left ovary,
likely a benign follicle or cyst.

Other: No ascites or pneumoperitoneum.

Musculoskeletal: New heterogeneous mixed sclerosis and lucency
involving the left bony pelvis, particularly the ilium with mild
expansion and cortical thickening without osseous destruction. No
suspicious osseous lesion identified elsewhere. Progressive,
advanced L4-5 facet arthrosis with grade 1 anterolisthesis of L4 on
L5.
IMPRESSION: 1. Colonic diverticulosis with minimal stranding adjacent to the
descending colon suggesting mild acute diverticulitis. No evidence
of perforation or abscess.
2. New mixed sclerosis and lucency involving the left bony pelvis
most suggestive of Paget's disease.
3.  Aortic Atherosclerosis ([id number].D).

## 2018-12-30 MED ORDER — IOPAMIDOL (ISOVUE-300) INJECTION 61%
100.0000 mL | Freq: Once | INTRAVENOUS | Status: AC | PRN
  Administered 2018-12-30: 100 mL via INTRAVENOUS

## 2019-03-10 DIAGNOSIS — E7849 Other hyperlipidemia: Secondary | ICD-10-CM | POA: Diagnosis not present

## 2019-03-10 DIAGNOSIS — E038 Other specified hypothyroidism: Secondary | ICD-10-CM | POA: Diagnosis not present

## 2019-03-10 DIAGNOSIS — I10 Essential (primary) hypertension: Secondary | ICD-10-CM | POA: Diagnosis not present

## 2019-03-10 DIAGNOSIS — R82998 Other abnormal findings in urine: Secondary | ICD-10-CM | POA: Diagnosis not present

## 2019-03-10 DIAGNOSIS — Z0000 Encounter for general adult medical examination without abnormal findings: Secondary | ICD-10-CM | POA: Diagnosis not present

## 2019-03-14 DIAGNOSIS — D352 Benign neoplasm of pituitary gland: Secondary | ICD-10-CM | POA: Diagnosis not present

## 2019-03-14 DIAGNOSIS — Z0000 Encounter for general adult medical examination without abnormal findings: Secondary | ICD-10-CM | POA: Diagnosis not present

## 2019-03-14 DIAGNOSIS — E039 Hypothyroidism, unspecified: Secondary | ICD-10-CM | POA: Diagnosis not present

## 2019-03-14 DIAGNOSIS — I10 Essential (primary) hypertension: Secondary | ICD-10-CM | POA: Diagnosis not present

## 2019-03-14 DIAGNOSIS — Z1331 Encounter for screening for depression: Secondary | ICD-10-CM | POA: Diagnosis not present

## 2019-03-14 DIAGNOSIS — E785 Hyperlipidemia, unspecified: Secondary | ICD-10-CM | POA: Diagnosis not present

## 2019-03-29 DIAGNOSIS — M5137 Other intervertebral disc degeneration, lumbosacral region: Secondary | ICD-10-CM | POA: Diagnosis not present

## 2019-03-29 DIAGNOSIS — M549 Dorsalgia, unspecified: Secondary | ICD-10-CM | POA: Diagnosis not present

## 2019-03-29 DIAGNOSIS — M5416 Radiculopathy, lumbar region: Secondary | ICD-10-CM | POA: Diagnosis not present

## 2019-03-29 DIAGNOSIS — M9905 Segmental and somatic dysfunction of pelvic region: Secondary | ICD-10-CM | POA: Diagnosis not present

## 2019-03-29 DIAGNOSIS — M9914 Subluxation complex (vertebral) of sacral region: Secondary | ICD-10-CM | POA: Diagnosis not present

## 2019-03-29 DIAGNOSIS — M9903 Segmental and somatic dysfunction of lumbar region: Secondary | ICD-10-CM | POA: Diagnosis not present

## 2019-04-17 DIAGNOSIS — M9914 Subluxation complex (vertebral) of sacral region: Secondary | ICD-10-CM | POA: Diagnosis not present

## 2019-04-17 DIAGNOSIS — M9903 Segmental and somatic dysfunction of lumbar region: Secondary | ICD-10-CM | POA: Diagnosis not present

## 2019-04-17 DIAGNOSIS — M9905 Segmental and somatic dysfunction of pelvic region: Secondary | ICD-10-CM | POA: Diagnosis not present

## 2019-04-17 DIAGNOSIS — M5137 Other intervertebral disc degeneration, lumbosacral region: Secondary | ICD-10-CM | POA: Diagnosis not present

## 2019-04-18 DIAGNOSIS — M9903 Segmental and somatic dysfunction of lumbar region: Secondary | ICD-10-CM | POA: Diagnosis not present

## 2019-04-18 DIAGNOSIS — M5137 Other intervertebral disc degeneration, lumbosacral region: Secondary | ICD-10-CM | POA: Diagnosis not present

## 2019-04-18 DIAGNOSIS — M9914 Subluxation complex (vertebral) of sacral region: Secondary | ICD-10-CM | POA: Diagnosis not present

## 2019-04-18 DIAGNOSIS — M9905 Segmental and somatic dysfunction of pelvic region: Secondary | ICD-10-CM | POA: Diagnosis not present

## 2019-04-19 DIAGNOSIS — M9903 Segmental and somatic dysfunction of lumbar region: Secondary | ICD-10-CM | POA: Diagnosis not present

## 2019-04-19 DIAGNOSIS — M9905 Segmental and somatic dysfunction of pelvic region: Secondary | ICD-10-CM | POA: Diagnosis not present

## 2019-04-19 DIAGNOSIS — M5137 Other intervertebral disc degeneration, lumbosacral region: Secondary | ICD-10-CM | POA: Diagnosis not present

## 2019-04-19 DIAGNOSIS — M9914 Subluxation complex (vertebral) of sacral region: Secondary | ICD-10-CM | POA: Diagnosis not present

## 2019-04-20 DIAGNOSIS — M9905 Segmental and somatic dysfunction of pelvic region: Secondary | ICD-10-CM | POA: Diagnosis not present

## 2019-04-20 DIAGNOSIS — M9914 Subluxation complex (vertebral) of sacral region: Secondary | ICD-10-CM | POA: Diagnosis not present

## 2019-04-20 DIAGNOSIS — M9903 Segmental and somatic dysfunction of lumbar region: Secondary | ICD-10-CM | POA: Diagnosis not present

## 2019-04-20 DIAGNOSIS — M5137 Other intervertebral disc degeneration, lumbosacral region: Secondary | ICD-10-CM | POA: Diagnosis not present

## 2019-04-24 DIAGNOSIS — M9903 Segmental and somatic dysfunction of lumbar region: Secondary | ICD-10-CM | POA: Diagnosis not present

## 2019-04-24 DIAGNOSIS — M9905 Segmental and somatic dysfunction of pelvic region: Secondary | ICD-10-CM | POA: Diagnosis not present

## 2019-04-24 DIAGNOSIS — M5137 Other intervertebral disc degeneration, lumbosacral region: Secondary | ICD-10-CM | POA: Diagnosis not present

## 2019-04-24 DIAGNOSIS — M9914 Subluxation complex (vertebral) of sacral region: Secondary | ICD-10-CM | POA: Diagnosis not present

## 2019-04-25 DIAGNOSIS — M9905 Segmental and somatic dysfunction of pelvic region: Secondary | ICD-10-CM | POA: Diagnosis not present

## 2019-04-25 DIAGNOSIS — M5137 Other intervertebral disc degeneration, lumbosacral region: Secondary | ICD-10-CM | POA: Diagnosis not present

## 2019-04-25 DIAGNOSIS — M9903 Segmental and somatic dysfunction of lumbar region: Secondary | ICD-10-CM | POA: Diagnosis not present

## 2019-04-25 DIAGNOSIS — M9914 Subluxation complex (vertebral) of sacral region: Secondary | ICD-10-CM | POA: Diagnosis not present

## 2019-04-27 DIAGNOSIS — M9914 Subluxation complex (vertebral) of sacral region: Secondary | ICD-10-CM | POA: Diagnosis not present

## 2019-04-27 DIAGNOSIS — M9905 Segmental and somatic dysfunction of pelvic region: Secondary | ICD-10-CM | POA: Diagnosis not present

## 2019-04-27 DIAGNOSIS — M5137 Other intervertebral disc degeneration, lumbosacral region: Secondary | ICD-10-CM | POA: Diagnosis not present

## 2019-04-27 DIAGNOSIS — M9903 Segmental and somatic dysfunction of lumbar region: Secondary | ICD-10-CM | POA: Diagnosis not present

## 2019-05-01 DIAGNOSIS — M9914 Subluxation complex (vertebral) of sacral region: Secondary | ICD-10-CM | POA: Diagnosis not present

## 2019-05-01 DIAGNOSIS — M5137 Other intervertebral disc degeneration, lumbosacral region: Secondary | ICD-10-CM | POA: Diagnosis not present

## 2019-05-01 DIAGNOSIS — M9903 Segmental and somatic dysfunction of lumbar region: Secondary | ICD-10-CM | POA: Diagnosis not present

## 2019-05-01 DIAGNOSIS — M9905 Segmental and somatic dysfunction of pelvic region: Secondary | ICD-10-CM | POA: Diagnosis not present

## 2019-05-02 DIAGNOSIS — M9905 Segmental and somatic dysfunction of pelvic region: Secondary | ICD-10-CM | POA: Diagnosis not present

## 2019-05-02 DIAGNOSIS — M5137 Other intervertebral disc degeneration, lumbosacral region: Secondary | ICD-10-CM | POA: Diagnosis not present

## 2019-05-02 DIAGNOSIS — M9903 Segmental and somatic dysfunction of lumbar region: Secondary | ICD-10-CM | POA: Diagnosis not present

## 2019-05-02 DIAGNOSIS — M9914 Subluxation complex (vertebral) of sacral region: Secondary | ICD-10-CM | POA: Diagnosis not present

## 2019-05-04 DIAGNOSIS — M9905 Segmental and somatic dysfunction of pelvic region: Secondary | ICD-10-CM | POA: Diagnosis not present

## 2019-05-04 DIAGNOSIS — M5137 Other intervertebral disc degeneration, lumbosacral region: Secondary | ICD-10-CM | POA: Diagnosis not present

## 2019-05-04 DIAGNOSIS — M9903 Segmental and somatic dysfunction of lumbar region: Secondary | ICD-10-CM | POA: Diagnosis not present

## 2019-05-04 DIAGNOSIS — M9914 Subluxation complex (vertebral) of sacral region: Secondary | ICD-10-CM | POA: Diagnosis not present

## 2019-05-08 DIAGNOSIS — M5137 Other intervertebral disc degeneration, lumbosacral region: Secondary | ICD-10-CM | POA: Diagnosis not present

## 2019-05-08 DIAGNOSIS — M9903 Segmental and somatic dysfunction of lumbar region: Secondary | ICD-10-CM | POA: Diagnosis not present

## 2019-05-08 DIAGNOSIS — M9905 Segmental and somatic dysfunction of pelvic region: Secondary | ICD-10-CM | POA: Diagnosis not present

## 2019-05-08 DIAGNOSIS — M9914 Subluxation complex (vertebral) of sacral region: Secondary | ICD-10-CM | POA: Diagnosis not present

## 2019-05-09 DIAGNOSIS — M9914 Subluxation complex (vertebral) of sacral region: Secondary | ICD-10-CM | POA: Diagnosis not present

## 2019-05-09 DIAGNOSIS — M9905 Segmental and somatic dysfunction of pelvic region: Secondary | ICD-10-CM | POA: Diagnosis not present

## 2019-05-09 DIAGNOSIS — M5137 Other intervertebral disc degeneration, lumbosacral region: Secondary | ICD-10-CM | POA: Diagnosis not present

## 2019-05-09 DIAGNOSIS — M9903 Segmental and somatic dysfunction of lumbar region: Secondary | ICD-10-CM | POA: Diagnosis not present

## 2019-05-11 DIAGNOSIS — M9914 Subluxation complex (vertebral) of sacral region: Secondary | ICD-10-CM | POA: Diagnosis not present

## 2019-05-11 DIAGNOSIS — M9905 Segmental and somatic dysfunction of pelvic region: Secondary | ICD-10-CM | POA: Diagnosis not present

## 2019-05-11 DIAGNOSIS — M9903 Segmental and somatic dysfunction of lumbar region: Secondary | ICD-10-CM | POA: Diagnosis not present

## 2019-05-11 DIAGNOSIS — M5137 Other intervertebral disc degeneration, lumbosacral region: Secondary | ICD-10-CM | POA: Diagnosis not present

## 2019-05-15 DIAGNOSIS — M9914 Subluxation complex (vertebral) of sacral region: Secondary | ICD-10-CM | POA: Diagnosis not present

## 2019-05-15 DIAGNOSIS — M9905 Segmental and somatic dysfunction of pelvic region: Secondary | ICD-10-CM | POA: Diagnosis not present

## 2019-05-15 DIAGNOSIS — M5137 Other intervertebral disc degeneration, lumbosacral region: Secondary | ICD-10-CM | POA: Diagnosis not present

## 2019-05-15 DIAGNOSIS — M9903 Segmental and somatic dysfunction of lumbar region: Secondary | ICD-10-CM | POA: Diagnosis not present

## 2019-05-16 DIAGNOSIS — M9905 Segmental and somatic dysfunction of pelvic region: Secondary | ICD-10-CM | POA: Diagnosis not present

## 2019-05-16 DIAGNOSIS — M9914 Subluxation complex (vertebral) of sacral region: Secondary | ICD-10-CM | POA: Diagnosis not present

## 2019-05-16 DIAGNOSIS — M5137 Other intervertebral disc degeneration, lumbosacral region: Secondary | ICD-10-CM | POA: Diagnosis not present

## 2019-05-16 DIAGNOSIS — M9903 Segmental and somatic dysfunction of lumbar region: Secondary | ICD-10-CM | POA: Diagnosis not present

## 2019-05-18 DIAGNOSIS — M9914 Subluxation complex (vertebral) of sacral region: Secondary | ICD-10-CM | POA: Diagnosis not present

## 2019-05-18 DIAGNOSIS — M9905 Segmental and somatic dysfunction of pelvic region: Secondary | ICD-10-CM | POA: Diagnosis not present

## 2019-05-18 DIAGNOSIS — M5137 Other intervertebral disc degeneration, lumbosacral region: Secondary | ICD-10-CM | POA: Diagnosis not present

## 2019-05-18 DIAGNOSIS — M9903 Segmental and somatic dysfunction of lumbar region: Secondary | ICD-10-CM | POA: Diagnosis not present

## 2019-07-28 DIAGNOSIS — L720 Epidermal cyst: Secondary | ICD-10-CM | POA: Diagnosis not present

## 2019-07-28 DIAGNOSIS — L821 Other seborrheic keratosis: Secondary | ICD-10-CM | POA: Diagnosis not present

## 2019-07-28 DIAGNOSIS — L820 Inflamed seborrheic keratosis: Secondary | ICD-10-CM | POA: Diagnosis not present

## 2019-08-02 DIAGNOSIS — M9905 Segmental and somatic dysfunction of pelvic region: Secondary | ICD-10-CM | POA: Diagnosis not present

## 2019-08-02 DIAGNOSIS — M5137 Other intervertebral disc degeneration, lumbosacral region: Secondary | ICD-10-CM | POA: Diagnosis not present

## 2019-08-02 DIAGNOSIS — M9914 Subluxation complex (vertebral) of sacral region: Secondary | ICD-10-CM | POA: Diagnosis not present

## 2019-08-02 DIAGNOSIS — M9903 Segmental and somatic dysfunction of lumbar region: Secondary | ICD-10-CM | POA: Diagnosis not present

## 2019-08-15 DIAGNOSIS — Z23 Encounter for immunization: Secondary | ICD-10-CM | POA: Diagnosis not present

## 2019-08-24 DIAGNOSIS — D225 Melanocytic nevi of trunk: Secondary | ICD-10-CM | POA: Diagnosis not present

## 2019-08-24 DIAGNOSIS — L720 Epidermal cyst: Secondary | ICD-10-CM | POA: Diagnosis not present

## 2019-09-30 ENCOUNTER — Ambulatory Visit
Admission: EM | Admit: 2019-09-30 | Discharge: 2019-09-30 | Disposition: A | Discharge status: Home / Self Care | Payer: BC Managed Care – PPO | Attending: Physician Assistant | Admitting: Physician Assistant

## 2019-09-30 ENCOUNTER — Encounter: Payer: Self-pay | Admitting: Emergency Medicine

## 2019-09-30 ENCOUNTER — Other Ambulatory Visit: Payer: Self-pay

## 2019-09-30 DIAGNOSIS — J069 Acute upper respiratory infection, unspecified: Secondary | ICD-10-CM | POA: Diagnosis not present

## 2019-09-30 DIAGNOSIS — Z20828 Contact with and (suspected) exposure to other viral communicable diseases: Secondary | ICD-10-CM

## 2019-09-30 DIAGNOSIS — Z20822 Contact with and (suspected) exposure to covid-19: Secondary | ICD-10-CM

## 2019-09-30 LAB — POC SARS CORONAVIRUS 2 AG -  ED: SARS Coronavirus 2 Ag: NEGATIVE

## 2019-09-30 NOTE — ED Provider Notes (Signed)
[facility]    CSN: [id number] Arrival date & time: 09/30/19  0941      History   Chief Complaint Chief Complaint  Patient presents with  . Covid Exposure  . Headache    HPI SHAKEENA KAFER is a 48 y.o. female.   48 year old female comes in for 1 week history of URI symptoms, positive COVID exposure.  She has had congestion, headache, throat irritation, fatigue. Denies fever, chills, body aches. Denies abdominal pain, nausea, vomiting, diarrhea. Denies shortness of breath, loss of taste/smell. Works in [employer] treasurer, but currently on holiday.      Past Medical History:  Diagnosis Date  . Anemia   . Hyperlipidemia   . Hypertension   . Hyperthyroidism   . Hypothyroid     There are no problems to display for this patient.   Past Surgical History:  Procedure Laterality Date  . CESAREAN SECTION    . ENDOMETRIAL ABLATION    . KIDNEY STONE SURGERY    . ORIF ANKLE FRACTURE  10/15/2011   Procedure: OPEN REDUCTION INTERNAL FIXATION (ORIF) ANKLE FRACTURE;  Surgeon: Johnn Hai;  Location: [facility];  Service: Orthopedics;  Laterality: Right;    OB History   No obstetric history on file.      Home Medications    Prior to Admission medications   Medication Sig Start Date End Date Taking? Authorizing Provider  calcium carbonate (TUMS - DOSED IN MG ELEMENTAL CALCIUM) 500 MG chewable tablet Chew 3-4 tablets by mouth at bedtime.      [provider]  ezetimibe-simvastatin (VYTORIN) 10-20 MG tablet Take 1 tablet daily by mouth. 07/28/17   [provider]  lansoprazole (PREVACID) 15 MG capsule Take 15 mg by mouth daily as needed. For indigestion     [provider]  levothyroxine (SYNTHROID, LEVOTHROID) 100 MCG tablet Take 1 tablet daily by mouth. 07/28/17   [provider]  loratadine (CLARITIN) 10 MG tablet Take 10 mg by mouth daily as needed. allergies    [provider]  montelukast (SINGULAIR) 10 MG tablet Take 1  tablet daily by mouth. 07/28/17   [provider]  olmesartan (BENICAR) 20 MG tablet Take 1 tablet daily by mouth. 07/28/17   [provider]  Prenatal Vit-Fe Fumarate-FA (MULTIVITAMIN-PRENATAL) 27-0.8 MG TABS Take 1 tablet by mouth daily.      [provider]    Family History Family History  Problem Relation Age of Onset  . Heart disease Father   . Colon cancer Paternal Grandfather     Social History Social History   Tobacco Use  . Smoking status: Never Smoker  . Smokeless tobacco: Never Used  Substance Use Topics  . Alcohol use: No  . Drug use: No     Allergies   Patient has no known allergies.   Review of Systems Review of Systems  Reason unable to perform ROS: See HPI as above.     Physical Exam Triage Vital Signs ED Triage Vitals [09/30/19 1024]  Enc Vitals Group     BP (!) 144/83     Pulse Rate 72     Resp 18     Temp 98.4 F (36.9 C)     Temp src      SpO2 99 %     Weight      Height      Head Circumference      Peak Flow      Pain Score 0  Pain Loc      Pain Edu?      Excl. in GC?    No data found.  Updated Vital Signs BP (!) 144/83   Pulse 72   Temp 98.4 F (36.9 C)   Resp 18   SpO2 99%   Physical Exam Constitutional:      General: She is not in acute distress.    Appearance: Normal appearance. She is not ill-appearing, toxic-appearing or diaphoretic.  HENT:     Head: Normocephalic and atraumatic.     Mouth/Throat:     Mouth: Mucous membranes are moist.     Pharynx: Oropharynx is clear. Uvula midline.  Cardiovascular:     Rate and Rhythm: Normal rate and regular rhythm.     Heart sounds: Normal heart sounds. No murmur. No friction rub. No gallop.   Pulmonary:     Effort: Pulmonary effort is normal. No accessory muscle usage, prolonged expiration, respiratory distress or retractions.     Comments: Lungs clear to auscultation without adventitious lung sounds. Musculoskeletal:     Cervical back:  Normal range of motion and neck supple.  Neurological:     General: No focal deficit present.     Mental Status: She is alert and oriented to person, place, and time.      UC Treatments / Results  Labs (all labs ordered are listed, but only abnormal results are displayed) Labs Reviewed  POC SARS CORONAVIRUS 2 AG -  ED - Normal  NOVEL CORONAVIRUS, NAA    EKG   Radiology No results found.  Procedures Procedures (including critical care time)  Medications Ordered in UC Medications - No data to display  Initial Impression / Assessment and Plan / UC Course  I have reviewed the triage vital signs and the nursing notes.  Pertinent labs & imaging results that were available during my care of the patient were reviewed by me and considered in my medical decision making (see chart for details).    Rapid COVID negative. COVID PCR test ordered. Patient to quarantine until testing results return. No alarming signs on exam.  Patient speaking in full sentences without respiratory distress.  Symptomatic treatment discussed.  Push fluids.  Return precautions given.  Patient expresses understanding and agrees to plan.  Final Clinical Impressions(s) / UC Diagnoses   Final diagnoses:  Exposure to COVID-19 virus  URI, acute   ED Prescriptions    None     PDMP not reviewed this encounter.   Belinda Fisher, PA-C 09/30/19 1114

## 2019-09-30 NOTE — ED Triage Notes (Signed)
Pt states shes been exposed to covid, her friend tested positive and shes been around her friend all week. Pt works in [employer] treasurer. Pt c/o congestion, headache, scratchy throat, fatigue.

## 2019-09-30 NOTE — Discharge Instructions (Signed)
Rapid COVID negative. COVID PCR testing ordered. I would like you to quarantine until testing results. You can take over the counter flonase/nasacort to help with nasal congestion/drainage. If experiencing shortness of breath, trouble breathing, go to the emergency department for further evaluation needed.  

## 2019-10-02 LAB — NOVEL CORONAVIRUS, NAA: SARS-CoV-2, NAA: NOT DETECTED

## 2019-10-03 ENCOUNTER — Ambulatory Visit: Payer: BC Managed Care – PPO | Attending: Internal Medicine

## 2019-10-03 DIAGNOSIS — Z20822 Contact with and (suspected) exposure to covid-19: Secondary | ICD-10-CM

## 2019-10-03 DIAGNOSIS — Z20828 Contact with and (suspected) exposure to other viral communicable diseases: Secondary | ICD-10-CM | POA: Diagnosis not present

## 2019-10-05 LAB — NOVEL CORONAVIRUS, NAA: SARS-CoV-2, NAA: NOT DETECTED

## 2019-11-15 DIAGNOSIS — M9903 Segmental and somatic dysfunction of lumbar region: Secondary | ICD-10-CM | POA: Diagnosis not present

## 2019-11-15 DIAGNOSIS — M9914 Subluxation complex (vertebral) of sacral region: Secondary | ICD-10-CM | POA: Diagnosis not present

## 2019-11-15 DIAGNOSIS — M9905 Segmental and somatic dysfunction of pelvic region: Secondary | ICD-10-CM | POA: Diagnosis not present

## 2019-11-15 DIAGNOSIS — M5137 Other intervertebral disc degeneration, lumbosacral region: Secondary | ICD-10-CM | POA: Diagnosis not present

## 2019-11-16 DIAGNOSIS — M9903 Segmental and somatic dysfunction of lumbar region: Secondary | ICD-10-CM | POA: Diagnosis not present

## 2019-11-16 DIAGNOSIS — M9914 Subluxation complex (vertebral) of sacral region: Secondary | ICD-10-CM | POA: Diagnosis not present

## 2019-11-16 DIAGNOSIS — M5137 Other intervertebral disc degeneration, lumbosacral region: Secondary | ICD-10-CM | POA: Diagnosis not present

## 2019-11-16 DIAGNOSIS — M9905 Segmental and somatic dysfunction of pelvic region: Secondary | ICD-10-CM | POA: Diagnosis not present

## 2019-11-20 DIAGNOSIS — M9905 Segmental and somatic dysfunction of pelvic region: Secondary | ICD-10-CM | POA: Diagnosis not present

## 2019-11-20 DIAGNOSIS — M5137 Other intervertebral disc degeneration, lumbosacral region: Secondary | ICD-10-CM | POA: Diagnosis not present

## 2019-11-20 DIAGNOSIS — M9914 Subluxation complex (vertebral) of sacral region: Secondary | ICD-10-CM | POA: Diagnosis not present

## 2019-11-20 DIAGNOSIS — M9903 Segmental and somatic dysfunction of lumbar region: Secondary | ICD-10-CM | POA: Diagnosis not present

## 2019-11-22 DIAGNOSIS — M9905 Segmental and somatic dysfunction of pelvic region: Secondary | ICD-10-CM | POA: Diagnosis not present

## 2019-11-22 DIAGNOSIS — M9903 Segmental and somatic dysfunction of lumbar region: Secondary | ICD-10-CM | POA: Diagnosis not present

## 2019-11-22 DIAGNOSIS — M5137 Other intervertebral disc degeneration, lumbosacral region: Secondary | ICD-10-CM | POA: Diagnosis not present

## 2019-11-22 DIAGNOSIS — M9914 Subluxation complex (vertebral) of sacral region: Secondary | ICD-10-CM | POA: Diagnosis not present

## 2019-11-27 DIAGNOSIS — M9903 Segmental and somatic dysfunction of lumbar region: Secondary | ICD-10-CM | POA: Diagnosis not present

## 2019-11-27 DIAGNOSIS — M5137 Other intervertebral disc degeneration, lumbosacral region: Secondary | ICD-10-CM | POA: Diagnosis not present

## 2019-11-27 DIAGNOSIS — M9914 Subluxation complex (vertebral) of sacral region: Secondary | ICD-10-CM | POA: Diagnosis not present

## 2019-11-27 DIAGNOSIS — M9905 Segmental and somatic dysfunction of pelvic region: Secondary | ICD-10-CM | POA: Diagnosis not present

## 2019-12-04 DIAGNOSIS — M9905 Segmental and somatic dysfunction of pelvic region: Secondary | ICD-10-CM | POA: Diagnosis not present

## 2019-12-04 DIAGNOSIS — M9903 Segmental and somatic dysfunction of lumbar region: Secondary | ICD-10-CM | POA: Diagnosis not present

## 2019-12-04 DIAGNOSIS — M9914 Subluxation complex (vertebral) of sacral region: Secondary | ICD-10-CM | POA: Diagnosis not present

## 2019-12-04 DIAGNOSIS — M5137 Other intervertebral disc degeneration, lumbosacral region: Secondary | ICD-10-CM | POA: Diagnosis not present

## 2019-12-07 DIAGNOSIS — M9914 Subluxation complex (vertebral) of sacral region: Secondary | ICD-10-CM | POA: Diagnosis not present

## 2019-12-07 DIAGNOSIS — M9905 Segmental and somatic dysfunction of pelvic region: Secondary | ICD-10-CM | POA: Diagnosis not present

## 2019-12-07 DIAGNOSIS — M9903 Segmental and somatic dysfunction of lumbar region: Secondary | ICD-10-CM | POA: Diagnosis not present

## 2019-12-07 DIAGNOSIS — M5137 Other intervertebral disc degeneration, lumbosacral region: Secondary | ICD-10-CM | POA: Diagnosis not present

## 2019-12-11 DIAGNOSIS — M9914 Subluxation complex (vertebral) of sacral region: Secondary | ICD-10-CM | POA: Diagnosis not present

## 2019-12-11 DIAGNOSIS — M9905 Segmental and somatic dysfunction of pelvic region: Secondary | ICD-10-CM | POA: Diagnosis not present

## 2019-12-11 DIAGNOSIS — M5137 Other intervertebral disc degeneration, lumbosacral region: Secondary | ICD-10-CM | POA: Diagnosis not present

## 2019-12-11 DIAGNOSIS — M9903 Segmental and somatic dysfunction of lumbar region: Secondary | ICD-10-CM | POA: Diagnosis not present

## 2019-12-18 DIAGNOSIS — M9914 Subluxation complex (vertebral) of sacral region: Secondary | ICD-10-CM | POA: Diagnosis not present

## 2019-12-18 DIAGNOSIS — M9905 Segmental and somatic dysfunction of pelvic region: Secondary | ICD-10-CM | POA: Diagnosis not present

## 2019-12-18 DIAGNOSIS — M9903 Segmental and somatic dysfunction of lumbar region: Secondary | ICD-10-CM | POA: Diagnosis not present

## 2019-12-18 DIAGNOSIS — M5137 Other intervertebral disc degeneration, lumbosacral region: Secondary | ICD-10-CM | POA: Diagnosis not present

## 2019-12-21 DIAGNOSIS — M9914 Subluxation complex (vertebral) of sacral region: Secondary | ICD-10-CM | POA: Diagnosis not present

## 2019-12-21 DIAGNOSIS — M9905 Segmental and somatic dysfunction of pelvic region: Secondary | ICD-10-CM | POA: Diagnosis not present

## 2019-12-21 DIAGNOSIS — M9903 Segmental and somatic dysfunction of lumbar region: Secondary | ICD-10-CM | POA: Diagnosis not present

## 2019-12-21 DIAGNOSIS — M5137 Other intervertebral disc degeneration, lumbosacral region: Secondary | ICD-10-CM | POA: Diagnosis not present

## 2019-12-28 DIAGNOSIS — M9914 Subluxation complex (vertebral) of sacral region: Secondary | ICD-10-CM | POA: Diagnosis not present

## 2019-12-28 DIAGNOSIS — M9905 Segmental and somatic dysfunction of pelvic region: Secondary | ICD-10-CM | POA: Diagnosis not present

## 2019-12-28 DIAGNOSIS — M9903 Segmental and somatic dysfunction of lumbar region: Secondary | ICD-10-CM | POA: Diagnosis not present

## 2019-12-28 DIAGNOSIS — M5137 Other intervertebral disc degeneration, lumbosacral region: Secondary | ICD-10-CM | POA: Diagnosis not present

## 2020-01-16 DIAGNOSIS — M5137 Other intervertebral disc degeneration, lumbosacral region: Secondary | ICD-10-CM | POA: Diagnosis not present

## 2020-01-16 DIAGNOSIS — M9905 Segmental and somatic dysfunction of pelvic region: Secondary | ICD-10-CM | POA: Diagnosis not present

## 2020-01-16 DIAGNOSIS — M9914 Subluxation complex (vertebral) of sacral region: Secondary | ICD-10-CM | POA: Diagnosis not present

## 2020-01-16 DIAGNOSIS — M9903 Segmental and somatic dysfunction of lumbar region: Secondary | ICD-10-CM | POA: Diagnosis not present

## 2020-02-07 DIAGNOSIS — M5137 Other intervertebral disc degeneration, lumbosacral region: Secondary | ICD-10-CM | POA: Diagnosis not present

## 2020-02-07 DIAGNOSIS — M9905 Segmental and somatic dysfunction of pelvic region: Secondary | ICD-10-CM | POA: Diagnosis not present

## 2020-02-07 DIAGNOSIS — M9914 Subluxation complex (vertebral) of sacral region: Secondary | ICD-10-CM | POA: Diagnosis not present

## 2020-02-07 DIAGNOSIS — M9903 Segmental and somatic dysfunction of lumbar region: Secondary | ICD-10-CM | POA: Diagnosis not present

## 2020-02-08 DIAGNOSIS — M5137 Other intervertebral disc degeneration, lumbosacral region: Secondary | ICD-10-CM | POA: Diagnosis not present

## 2020-02-08 DIAGNOSIS — M9903 Segmental and somatic dysfunction of lumbar region: Secondary | ICD-10-CM | POA: Diagnosis not present

## 2020-02-08 DIAGNOSIS — M9914 Subluxation complex (vertebral) of sacral region: Secondary | ICD-10-CM | POA: Diagnosis not present

## 2020-02-08 DIAGNOSIS — M9905 Segmental and somatic dysfunction of pelvic region: Secondary | ICD-10-CM | POA: Diagnosis not present

## 2020-02-12 DIAGNOSIS — M9914 Subluxation complex (vertebral) of sacral region: Secondary | ICD-10-CM | POA: Diagnosis not present

## 2020-02-12 DIAGNOSIS — M79671 Pain in right foot: Secondary | ICD-10-CM | POA: Diagnosis not present

## 2020-02-12 DIAGNOSIS — M5137 Other intervertebral disc degeneration, lumbosacral region: Secondary | ICD-10-CM | POA: Diagnosis not present

## 2020-02-12 DIAGNOSIS — M9905 Segmental and somatic dysfunction of pelvic region: Secondary | ICD-10-CM | POA: Diagnosis not present

## 2020-02-12 DIAGNOSIS — M9903 Segmental and somatic dysfunction of lumbar region: Secondary | ICD-10-CM | POA: Diagnosis not present

## 2020-02-14 DIAGNOSIS — M5137 Other intervertebral disc degeneration, lumbosacral region: Secondary | ICD-10-CM | POA: Diagnosis not present

## 2020-02-14 DIAGNOSIS — M9905 Segmental and somatic dysfunction of pelvic region: Secondary | ICD-10-CM | POA: Diagnosis not present

## 2020-02-14 DIAGNOSIS — M9914 Subluxation complex (vertebral) of sacral region: Secondary | ICD-10-CM | POA: Diagnosis not present

## 2020-02-14 DIAGNOSIS — M9903 Segmental and somatic dysfunction of lumbar region: Secondary | ICD-10-CM | POA: Diagnosis not present

## 2020-02-19 DIAGNOSIS — M5137 Other intervertebral disc degeneration, lumbosacral region: Secondary | ICD-10-CM | POA: Diagnosis not present

## 2020-02-19 DIAGNOSIS — M9905 Segmental and somatic dysfunction of pelvic region: Secondary | ICD-10-CM | POA: Diagnosis not present

## 2020-02-19 DIAGNOSIS — M9903 Segmental and somatic dysfunction of lumbar region: Secondary | ICD-10-CM | POA: Diagnosis not present

## 2020-02-19 DIAGNOSIS — M9914 Subluxation complex (vertebral) of sacral region: Secondary | ICD-10-CM | POA: Diagnosis not present

## 2020-02-26 DIAGNOSIS — M5137 Other intervertebral disc degeneration, lumbosacral region: Secondary | ICD-10-CM | POA: Diagnosis not present

## 2020-02-26 DIAGNOSIS — M9914 Subluxation complex (vertebral) of sacral region: Secondary | ICD-10-CM | POA: Diagnosis not present

## 2020-02-26 DIAGNOSIS — M9903 Segmental and somatic dysfunction of lumbar region: Secondary | ICD-10-CM | POA: Diagnosis not present

## 2020-02-26 DIAGNOSIS — M9905 Segmental and somatic dysfunction of pelvic region: Secondary | ICD-10-CM | POA: Diagnosis not present

## 2020-03-07 DIAGNOSIS — M9914 Subluxation complex (vertebral) of sacral region: Secondary | ICD-10-CM | POA: Diagnosis not present

## 2020-03-07 DIAGNOSIS — M5137 Other intervertebral disc degeneration, lumbosacral region: Secondary | ICD-10-CM | POA: Diagnosis not present

## 2020-03-07 DIAGNOSIS — M9903 Segmental and somatic dysfunction of lumbar region: Secondary | ICD-10-CM | POA: Diagnosis not present

## 2020-03-07 DIAGNOSIS — M9905 Segmental and somatic dysfunction of pelvic region: Secondary | ICD-10-CM | POA: Diagnosis not present

## 2020-06-05 DIAGNOSIS — E039 Hypothyroidism, unspecified: Secondary | ICD-10-CM | POA: Diagnosis not present

## 2020-06-05 DIAGNOSIS — E785 Hyperlipidemia, unspecified: Secondary | ICD-10-CM | POA: Diagnosis not present

## 2020-06-05 DIAGNOSIS — Z0000 Encounter for general adult medical examination without abnormal findings: Secondary | ICD-10-CM | POA: Diagnosis not present

## 2020-06-12 DIAGNOSIS — R82998 Other abnormal findings in urine: Secondary | ICD-10-CM | POA: Diagnosis not present

## 2020-06-12 DIAGNOSIS — R8290 Unspecified abnormal findings in urine: Secondary | ICD-10-CM | POA: Diagnosis not present

## 2020-06-12 DIAGNOSIS — I10 Essential (primary) hypertension: Secondary | ICD-10-CM | POA: Diagnosis not present

## 2020-06-12 DIAGNOSIS — Z0000 Encounter for general adult medical examination without abnormal findings: Secondary | ICD-10-CM | POA: Diagnosis not present

## 2020-07-22 DIAGNOSIS — R509 Fever, unspecified: Secondary | ICD-10-CM | POA: Diagnosis not present

## 2020-07-22 DIAGNOSIS — R059 Cough, unspecified: Secondary | ICD-10-CM | POA: Diagnosis not present

## 2020-08-07 DIAGNOSIS — Z23 Encounter for immunization: Secondary | ICD-10-CM | POA: Diagnosis not present

## 2020-08-21 DIAGNOSIS — L821 Other seborrheic keratosis: Secondary | ICD-10-CM | POA: Diagnosis not present

## 2020-08-21 DIAGNOSIS — L820 Inflamed seborrheic keratosis: Secondary | ICD-10-CM | POA: Diagnosis not present

## 2020-10-29 DIAGNOSIS — Z1231 Encounter for screening mammogram for malignant neoplasm of breast: Secondary | ICD-10-CM | POA: Diagnosis not present

## 2020-10-29 DIAGNOSIS — Z01419 Encounter for gynecological examination (general) (routine) without abnormal findings: Secondary | ICD-10-CM | POA: Diagnosis not present

## 2020-10-29 DIAGNOSIS — Z6838 Body mass index (BMI) 38.0-38.9, adult: Secondary | ICD-10-CM | POA: Diagnosis not present

## 2021-01-23 ENCOUNTER — Encounter: Payer: Self-pay | Admitting: Internal Medicine

## 2021-02-12 ENCOUNTER — Other Ambulatory Visit (HOSPITAL_COMMUNITY): Payer: Self-pay | Admitting: Internal Medicine

## 2021-02-12 ENCOUNTER — Other Ambulatory Visit (HOSPITAL_BASED_OUTPATIENT_CLINIC_OR_DEPARTMENT_OTHER): Payer: Self-pay | Admitting: Internal Medicine

## 2021-02-12 ENCOUNTER — Other Ambulatory Visit: Payer: Self-pay | Admitting: Internal Medicine

## 2021-02-12 DIAGNOSIS — R1032 Left lower quadrant pain: Secondary | ICD-10-CM | POA: Diagnosis not present

## 2021-02-12 DIAGNOSIS — E785 Hyperlipidemia, unspecified: Secondary | ICD-10-CM | POA: Diagnosis not present

## 2021-02-13 ENCOUNTER — Ambulatory Visit
Admission: RE | Admit: 2021-02-13 | Discharge: 2021-02-13 | Disposition: A | Discharge status: Home / Self Care | Payer: BC Managed Care – PPO | Source: Ambulatory Visit | Attending: Internal Medicine | Admitting: Internal Medicine

## 2021-02-13 ENCOUNTER — Other Ambulatory Visit: Payer: Self-pay | Admitting: Internal Medicine

## 2021-02-13 ENCOUNTER — Other Ambulatory Visit: Payer: Self-pay

## 2021-02-13 DIAGNOSIS — R1032 Left lower quadrant pain: Secondary | ICD-10-CM

## 2021-02-13 DIAGNOSIS — K8020 Calculus of gallbladder without cholecystitis without obstruction: Secondary | ICD-10-CM | POA: Diagnosis not present

## 2021-02-13 DIAGNOSIS — M4316 Spondylolisthesis, lumbar region: Secondary | ICD-10-CM | POA: Diagnosis not present

## 2021-02-13 DIAGNOSIS — K5790 Diverticulosis of intestine, part unspecified, without perforation or abscess without bleeding: Secondary | ICD-10-CM | POA: Diagnosis not present

## 2021-02-13 DIAGNOSIS — I708 Atherosclerosis of other arteries: Secondary | ICD-10-CM | POA: Diagnosis not present

## 2021-02-13 IMAGING — CT CT ABD-PELV W/ CM
2 of 5 series · 14 of 46 positions shown, 16 images · IV contrast (iopamidol)
Comparison: December 30, 2018

CLINICAL DATA: Left lower quadrant pain

EXAM:
CT ABDOMEN AND PELVIS WITH CONTRAST
TECHNIQUE: Multidetector CT imaging of the abdomen and pelvis was performed
using the standard protocol following bolus administration of
intravenous contrast.
CONTRAST:  100mL [id number] IOPAMIDOL ([id number]) INJECTION 76%

[Series 2: abd pelvis 5.00 br40 s3 axial · axial · 0.82mm/px · z∈[+1276,+1636]mm · 11 of 88 slices shown, 13 images]
[im 8/88  soft-tissue]
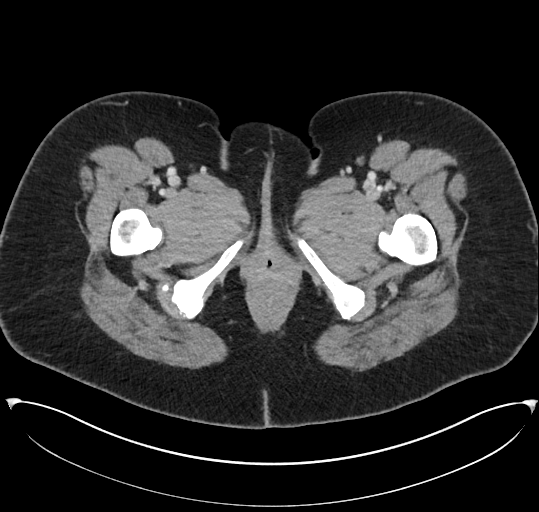
[im 8/88  bone]
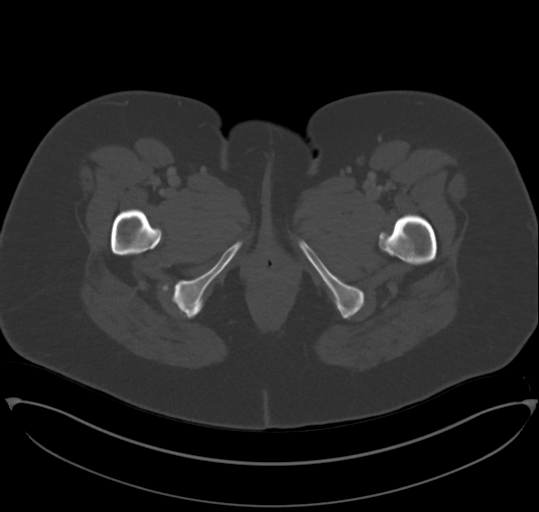
[im 15/88  soft-tissue]
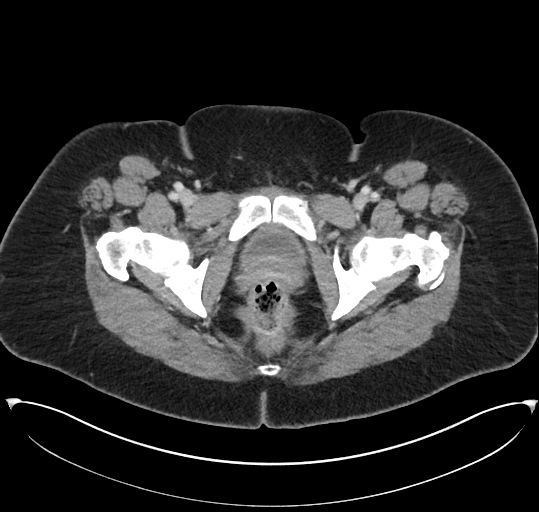
[im 22/88  soft-tissue]
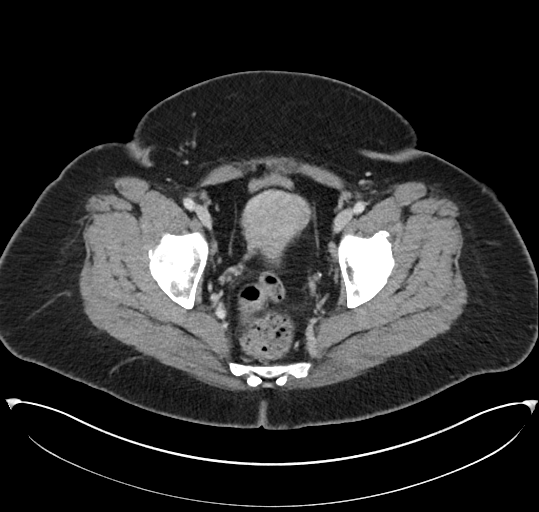
[im 30/88  soft-tissue]
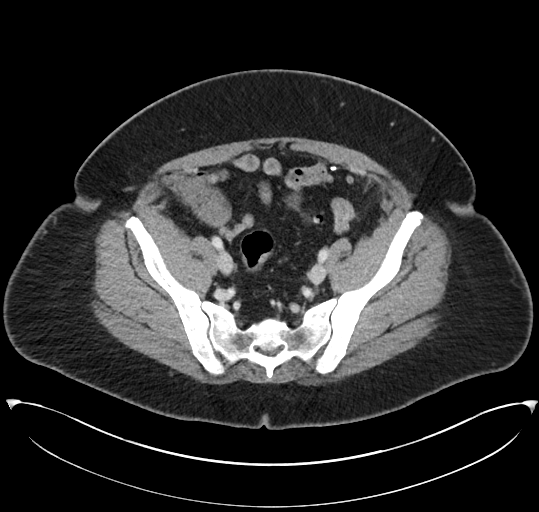
[im 37/88  soft-tissue]
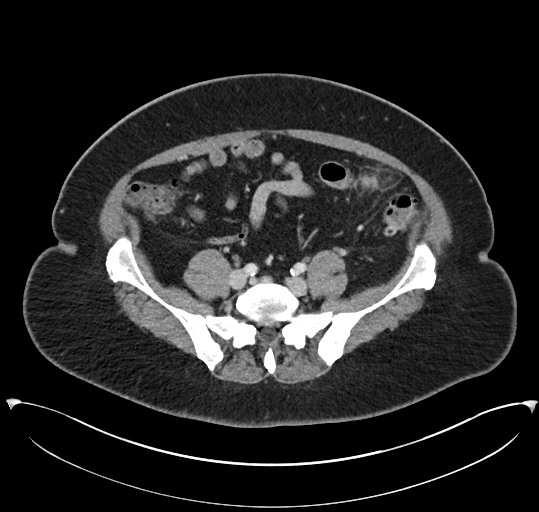
[im 44/88  soft-tissue]
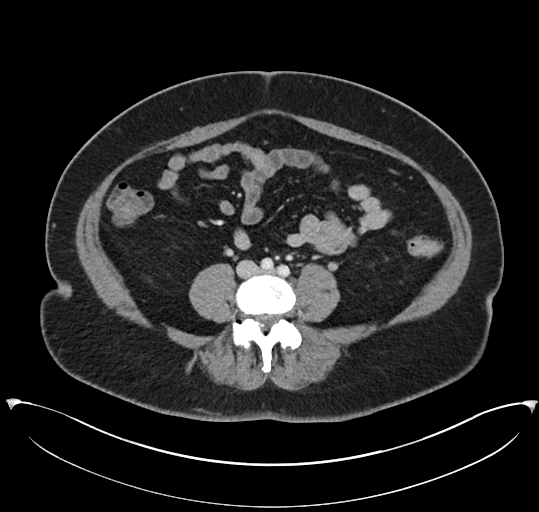
[im 51/88  soft-tissue]
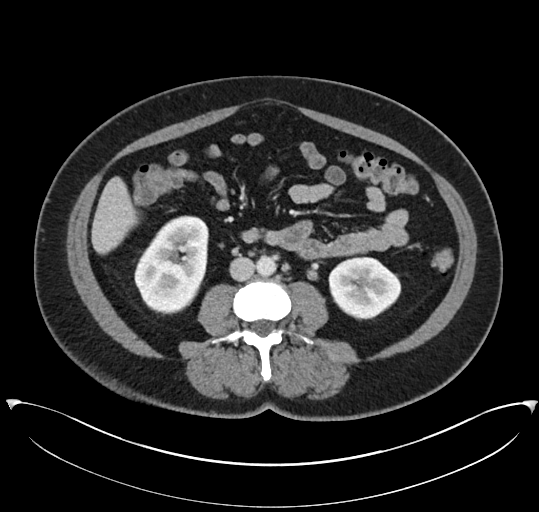
[im 59/88  soft-tissue]
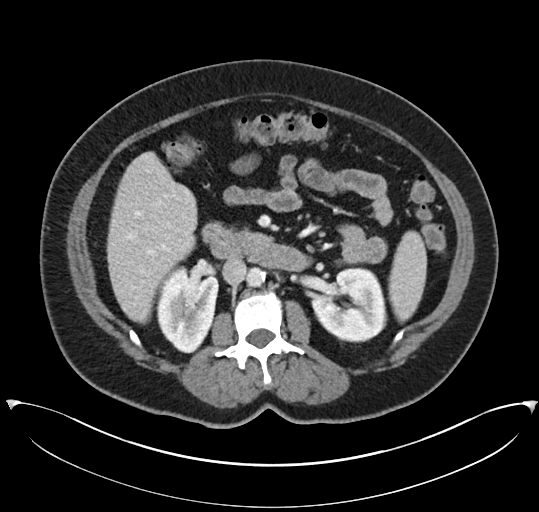
[im 66/88  soft-tissue]
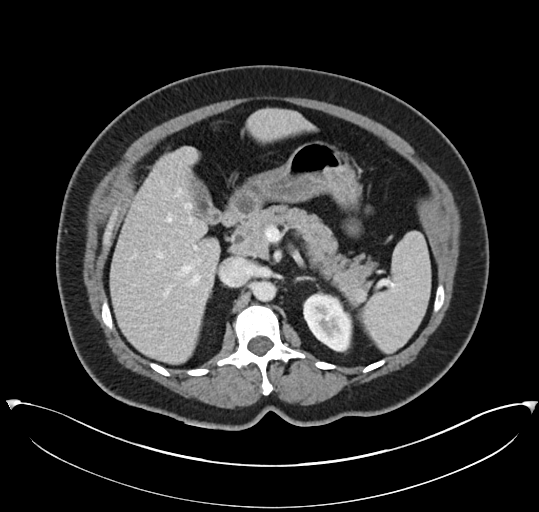
[im 66/88  bone]
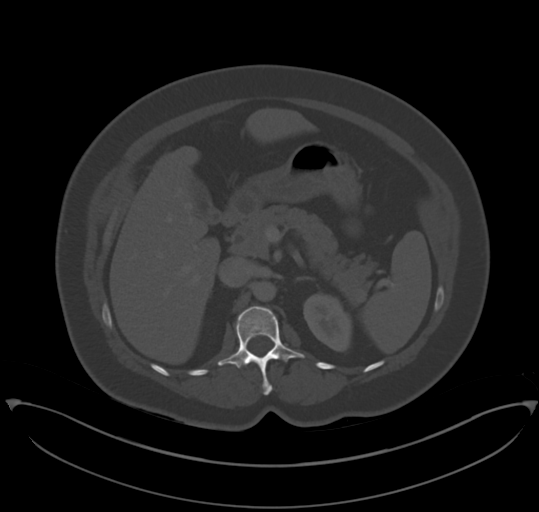
[im 73/88  soft-tissue]
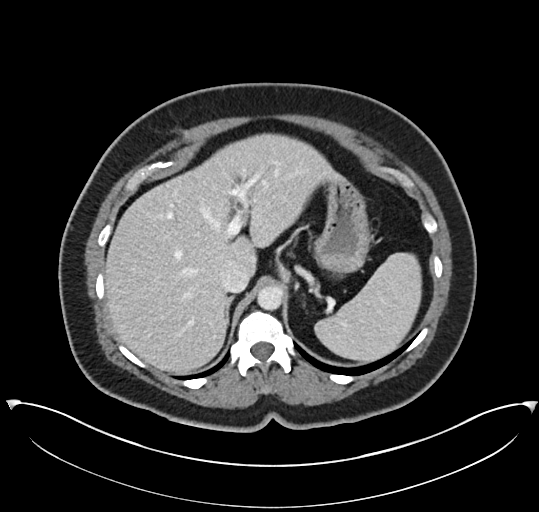
[im 80/88  soft-tissue]
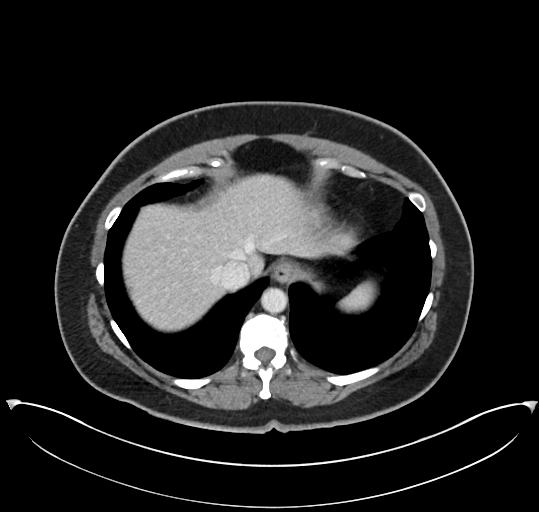

[Series 6: abd pelvis 2.00 br40 s3 cor · coronal · 0.87mm/px · 3 of 210 slices shown]
[im 70/210  soft-tissue]
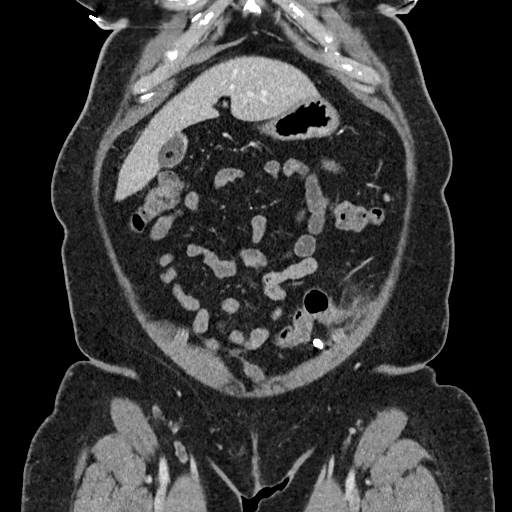
[im 93/210  soft-tissue]
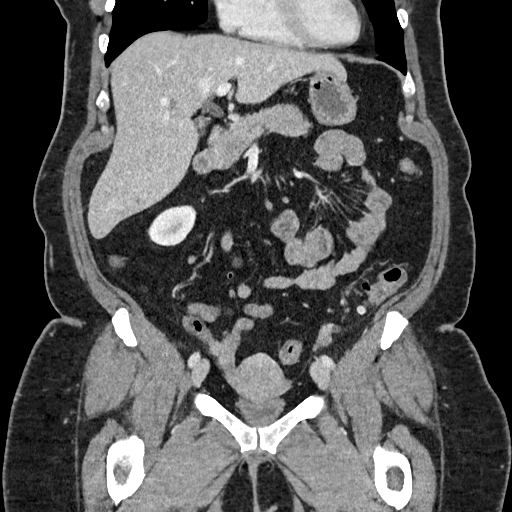
[im 117/210  soft-tissue]
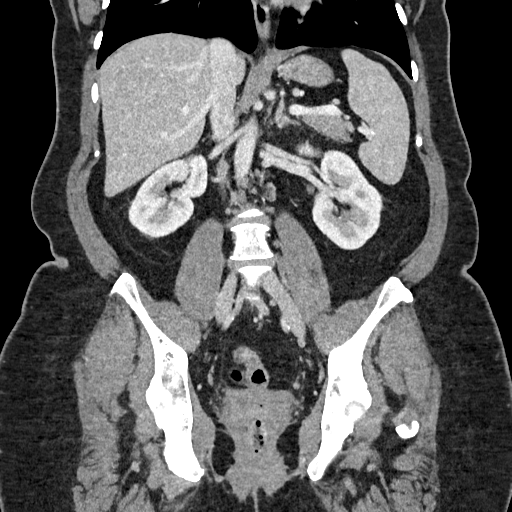

[14 of 46 positions shown; findings below may reference images not displayed]

FINDINGS: Lower chest: Lung bases are clear.

Hepatobiliary: No focal liver lesions are appreciable. Small
gallstones are noted within the gallbladder. Gallbladder wall does
not appear appreciably thickened by CT. No evident biliary duct
dilatation.

Pancreas: There is no pancreatic mass or inflammatory focus.

Spleen: No splenic lesions are evident.

Adrenals/Urinary Tract: Adrenals bilaterally appear unremarkable.
There is no appreciable renal mass or hydronephrosis on either side.
There is no evident renal or ureteral calculus on either side.
Urinary bladder is midline with wall thickness within normal limits.

Stomach/Bowel: There are sigmoid diverticula. There is an apparent
inflamed epiploic appendage along the proximal sigmoid which abuts
the inferior aspect of the rectus muscle at the level of the
superior aspect the left iliac crest. There is mild adjacent
mesenteric inflammation in this area. No other areas of inflammation
evident. A nearby surgical clip in this area is stable. There is no
fluid or evident perforation in this area.

Elsewhere there is no appreciable bowel wall or mesenteric
thickening. Scattered diverticula noted elsewhere in the colon
without inflammation. No bowel obstruction. Terminal ileum appears
normal. Appendix appears normal. No free air or portal venous air.

Vascular/Lymphatic: There is no abdominal aortic aneurysm. There are
foci of aortic and iliac artery atherosclerosis. Major venous
structures appear patent. No evident adenopathy in the abdomen or
pelvis.

Reproductive: Uterus is anteverted. No adnexal mass is appreciable.
Fallopian tube clip on the right. Suspected displaced tubal ligation
clip on the left with clip anterior to the fallopian tube.

Other: No abscess or ascites evident in the abdomen or pelvis. There
is mild fat in the umbilicus.

Musculoskeletal: There is stable 2 mm of anterolisthesis of L4 on L5
with degenerative change in this area. Stable mixed sclerosis and
lucency involving much of the mid to lower left iliac bone.
IMPRESSION: 1. There is an apparent inflamed epiploic appendage arising from the
proximal sigmoid colon with mild inflammation in the adjacent
mesentery. Suspect localized epiploic appendagitis. There are
diverticula in this area, although no overt diverticulitis
appreciable in this area. Scattered diverticula elsewhere in the
colon without inflammation.

2. No bowel obstruction. No abscess in the abdomen or pelvis. No
free air. Appendix appears normal.

3. Cholelithiasis. Gallbladder wall does not appear appreciably
thickened by CT.

4. No renal or ureteral calculi. No hydronephrosis. Urinary bladder
wall thickness normal.

5.  Suspect displaced tubal ligation clip on the left.

6. Mixed sclerotic and lucent area in the left iliac bone, stable
from previous study, likely representing Paget's disease.

7.  Aortic Atherosclerosis ([id number].R).

## 2021-02-13 MED ORDER — IOPAMIDOL (ISOVUE-370) INJECTION 76%
100.0000 mL | Freq: Once | INTRAVENOUS | Status: AC | PRN
  Administered 2021-02-13: 100 mL via INTRAVENOUS

## 2021-05-05 ENCOUNTER — Encounter: Payer: Self-pay | Admitting: Gastroenterology

## 2021-06-03 ENCOUNTER — Ambulatory Visit (INDEPENDENT_AMBULATORY_CARE_PROVIDER_SITE_OTHER): Payer: BC Managed Care – PPO | Admitting: Gastroenterology

## 2021-06-03 ENCOUNTER — Encounter: Payer: Self-pay | Admitting: Gastroenterology

## 2021-06-03 VITALS — BP 124/70 | HR 75 | Ht 63.0 in | Wt 213.1 lb

## 2021-06-03 DIAGNOSIS — Z1211 Encounter for screening for malignant neoplasm of colon: Secondary | ICD-10-CM | POA: Diagnosis not present

## 2021-06-03 NOTE — Progress Notes (Signed)
06/03/2021 Danielle Burnett [id number] 1971-08-10   HISTORY OF PRESENT ILLNESS:  This is a 50 year old female who is a patient of Dr. Marvell Fuller who is here today to discuss a colonoscopy.  She's never had one in the past.  She recently had an episode of epiploic appendagitis back in May that was documented on CT scan.  She had episode of diverticulitis a few years ago.  Currently she is feeling well.  No residual abdominal pain.  Her bowel habits are fairly normal.  She denies any rectal bleeding.   Past Medical History:  Diagnosis Date   Anemia    Hyperlipidemia    Hypertension    Hyperthyroidism    Hypothyroid    Past Surgical History:  Procedure Laterality Date   CESAREAN SECTION     ENDOMETRIAL ABLATION     KIDNEY STONE SURGERY     ORIF ANKLE FRACTURE  10/15/2011   Procedure: OPEN REDUCTION INTERNAL FIXATION (ORIF) ANKLE FRACTURE;  Surgeon: Javier Docker;  Location: [facility];  Service: Orthopedics;  Laterality: Right;    reports that she has never smoked. She has never used smokeless tobacco. She reports that she does not drink alcohol and does not use drugs. family history includes Colon cancer in her paternal grandfather; Heart disease in her father. No Known Allergies    Outpatient Encounter Medications as of 06/03/2021  Medication Sig   ezetimibe-simvastatin (VYTORIN) 10-20 MG tablet Take 1 tablet daily by mouth.   levothyroxine (SYNTHROID, LEVOTHROID) 100 MCG tablet Take 125 mcg by mouth daily.   loratadine (CLARITIN) 10 MG tablet Take 10 mg by mouth daily as needed. allergies   montelukast (SINGULAIR) 10 MG tablet Take 1 tablet daily by mouth.   olmesartan (BENICAR) 20 MG tablet Take 1 tablet daily by mouth.   ezetimibe (ZETIA) 10 MG tablet Take 10 mg by mouth daily.   mometasone (NASONEX) 50 MCG/ACT nasal spray Place 1 spray into the nose daily.   [DISCONTINUED] calcium carbonate (TUMS - DOSED IN MG ELEMENTAL CALCIUM) 500 MG chewable tablet Chew 3-4 tablets by mouth  at bedtime.     [DISCONTINUED] lansoprazole (PREVACID) 15 MG capsule Take 15 mg by mouth daily as needed. For indigestion    [DISCONTINUED] Prenatal Vit-Fe Fumarate-FA (MULTIVITAMIN-PRENATAL) 27-0.8 MG TABS Take 1 tablet by mouth daily.     No facility-administered encounter medications on file as of 06/03/2021.    REVIEW OF SYSTEMS  : All other systems reviewed and negative except where noted in the History of Present Illness.  PHYSICAL EXAM: BP 124/70   Pulse 75   Ht 5\' 3"  (1.6 m)   Wt 213 lb 2 oz (96.7 kg)   BMI 37.75 kg/m  General:  Well developed white female in no acute distress Head: Normocephalic and atraumatic Eyes:  Sclerae anicteric, conjunctiva pink. Ears: Normal auditory acuity Lungs: Clear throughout to auscultation; no W/R/R. Heart: Regular rate and rhythm; no M/R/G. Abdomen: Soft, non-distended.  BS present.  Non-tender. Rectal:  Will be done at the time of colonoscopy. Musculoskeletal: Symmetrical with no gross deformities  Skin: No lesions on visible extremities Extremities: No edema  Neurological: Alert oriented x 4, grossly non-focal Psychological:  Alert and cooperative. Normal mood and affect  ASSESSMENT AND PLAN: *CRC screening:  Never had a colonoscopy in the past.  Will schedule with Dr. .  The risks, benefits, and alternatives to colonoscopy were discussed with the patient and she consents to proceed. *Epiploic appendagitis:  Back in  May, seen on CT scan.  Resolved.  CC:  Chilton Greathouse, MD

## 2021-06-03 NOTE — Patient Instructions (Signed)
It was my pleasure to provide care to you today. Based on our discussion, I am providing you with my recommendations below:  RECOMMENDATION(S):   COLONOSCOPY:   You have been scheduled for a colonoscopy. Please follow written instructions given to you at your visit today.   PREP:   Please pick up your prep supplies at the pharmacy within the next 1-3 days.  INHALERS:   If you use inhalers (even only as needed), please bring them with you on the day of your procedure.  COLONOSCOPY TIPS:  To reduce nausea and dehydration, stay well hydrated for 3-4 days prior to the exam.  To prevent skin/hemorrhoid irritation - prior to wiping, put A&Dointment or vaseline on the toilet paper. Keep a towel or pad on the bed.  BEFORE STARTING YOUR PREP, drink  64oz of clear liquids in the morning. This will help to flush the colon and will ensure you are well hydrated!!!!  NOTE - This is in addition to the fluids required for to complete your prep. Use of a flavored hard candy, such as grape Jolly Rancher, can counteract some of the flavor of the prep and may prevent some nausea.    FOLLOW UP:  After your procedure, you will receive a call from my office staff regarding my recommendation for follow up.  BMI:  If you are age 64 or younger, your body mass index should be between 19-25. Your There is no height or weight on file to calculate BMI. If this is out of the aformentioned range listed, please consider follow up with your Primary Care Provider.   MY CHART:  The [facility] providers would like to encourage you to use MYCHART to communicate with providers for non-urgent requests or questions.  Due to long hold times on the telephone, sending your provider a message by MYCHART may be a faster and more efficient way to get a response.  Please allow 48 business hours for a response.  Please remember that this is for non-urgent requests.   Thank you for trusting me with your gastrointestinal care!     Jessica Zehr PA-C  

## 2021-06-19 ENCOUNTER — Telehealth: Payer: Self-pay | Admitting: Internal Medicine

## 2021-06-19 NOTE — Telephone Encounter (Signed)
Hi Dr. Leone Payor, this patient just called to cancel procedure that was scheduled for tomorrow 06/20/21 because she did not follow instructions pertaining to clear liquid diet. Patient thought that procedure was next Friday so she preferred to r/s. Patient has rescheduled to 07/23/21 at 8:30am. Thank you.

## 2021-06-19 NOTE — Telephone Encounter (Signed)
OK No charge 

## 2021-06-20 ENCOUNTER — Encounter: Payer: BC Managed Care – PPO | Admitting: Internal Medicine

## 2021-07-18 DIAGNOSIS — E785 Hyperlipidemia, unspecified: Secondary | ICD-10-CM | POA: Diagnosis not present

## 2021-07-18 DIAGNOSIS — E039 Hypothyroidism, unspecified: Secondary | ICD-10-CM | POA: Diagnosis not present

## 2021-07-21 DIAGNOSIS — D352 Benign neoplasm of pituitary gland: Secondary | ICD-10-CM | POA: Diagnosis not present

## 2021-07-23 ENCOUNTER — Encounter: Payer: BC Managed Care – PPO | Admitting: Internal Medicine

## 2021-08-05 DIAGNOSIS — R82998 Other abnormal findings in urine: Secondary | ICD-10-CM | POA: Diagnosis not present

## 2021-08-05 DIAGNOSIS — Z1389 Encounter for screening for other disorder: Secondary | ICD-10-CM | POA: Diagnosis not present

## 2021-08-05 DIAGNOSIS — I10 Essential (primary) hypertension: Secondary | ICD-10-CM | POA: Diagnosis not present

## 2021-08-05 DIAGNOSIS — Z1331 Encounter for screening for depression: Secondary | ICD-10-CM | POA: Diagnosis not present

## 2021-08-05 DIAGNOSIS — Z0000 Encounter for general adult medical examination without abnormal findings: Secondary | ICD-10-CM | POA: Diagnosis not present

## 2021-08-06 DIAGNOSIS — Z1212 Encounter for screening for malignant neoplasm of rectum: Secondary | ICD-10-CM | POA: Diagnosis not present

## 2022-03-04 DIAGNOSIS — M9902 Segmental and somatic dysfunction of thoracic region: Secondary | ICD-10-CM | POA: Diagnosis not present

## 2022-03-04 DIAGNOSIS — M5384 Other specified dorsopathies, thoracic region: Secondary | ICD-10-CM | POA: Diagnosis not present

## 2022-03-04 DIAGNOSIS — M9901 Segmental and somatic dysfunction of cervical region: Secondary | ICD-10-CM | POA: Diagnosis not present

## 2022-03-04 DIAGNOSIS — M50122 Cervical disc disorder at C5-C6 level with radiculopathy: Secondary | ICD-10-CM | POA: Diagnosis not present

## 2022-03-10 DIAGNOSIS — M9901 Segmental and somatic dysfunction of cervical region: Secondary | ICD-10-CM | POA: Diagnosis not present

## 2022-03-10 DIAGNOSIS — M5384 Other specified dorsopathies, thoracic region: Secondary | ICD-10-CM | POA: Diagnosis not present

## 2022-03-10 DIAGNOSIS — M9902 Segmental and somatic dysfunction of thoracic region: Secondary | ICD-10-CM | POA: Diagnosis not present

## 2022-03-10 DIAGNOSIS — M50122 Cervical disc disorder at C5-C6 level with radiculopathy: Secondary | ICD-10-CM | POA: Diagnosis not present

## 2022-03-12 DIAGNOSIS — M50122 Cervical disc disorder at C5-C6 level with radiculopathy: Secondary | ICD-10-CM | POA: Diagnosis not present

## 2022-03-12 DIAGNOSIS — M9901 Segmental and somatic dysfunction of cervical region: Secondary | ICD-10-CM | POA: Diagnosis not present

## 2022-03-12 DIAGNOSIS — M5384 Other specified dorsopathies, thoracic region: Secondary | ICD-10-CM | POA: Diagnosis not present

## 2022-03-12 DIAGNOSIS — M9902 Segmental and somatic dysfunction of thoracic region: Secondary | ICD-10-CM | POA: Diagnosis not present

## 2022-03-16 DIAGNOSIS — M5384 Other specified dorsopathies, thoracic region: Secondary | ICD-10-CM | POA: Diagnosis not present

## 2022-03-16 DIAGNOSIS — M50122 Cervical disc disorder at C5-C6 level with radiculopathy: Secondary | ICD-10-CM | POA: Diagnosis not present

## 2022-03-16 DIAGNOSIS — M9902 Segmental and somatic dysfunction of thoracic region: Secondary | ICD-10-CM | POA: Diagnosis not present

## 2022-03-16 DIAGNOSIS — M9901 Segmental and somatic dysfunction of cervical region: Secondary | ICD-10-CM | POA: Diagnosis not present

## 2022-03-19 DIAGNOSIS — M50122 Cervical disc disorder at C5-C6 level with radiculopathy: Secondary | ICD-10-CM | POA: Diagnosis not present

## 2022-03-19 DIAGNOSIS — M9901 Segmental and somatic dysfunction of cervical region: Secondary | ICD-10-CM | POA: Diagnosis not present

## 2022-03-19 DIAGNOSIS — M5384 Other specified dorsopathies, thoracic region: Secondary | ICD-10-CM | POA: Diagnosis not present

## 2022-03-19 DIAGNOSIS — M9902 Segmental and somatic dysfunction of thoracic region: Secondary | ICD-10-CM | POA: Diagnosis not present

## 2022-03-23 DIAGNOSIS — M9902 Segmental and somatic dysfunction of thoracic region: Secondary | ICD-10-CM | POA: Diagnosis not present

## 2022-03-23 DIAGNOSIS — M50122 Cervical disc disorder at C5-C6 level with radiculopathy: Secondary | ICD-10-CM | POA: Diagnosis not present

## 2022-03-23 DIAGNOSIS — M9901 Segmental and somatic dysfunction of cervical region: Secondary | ICD-10-CM | POA: Diagnosis not present

## 2022-03-23 DIAGNOSIS — M5384 Other specified dorsopathies, thoracic region: Secondary | ICD-10-CM | POA: Diagnosis not present

## 2022-03-25 DIAGNOSIS — M50122 Cervical disc disorder at C5-C6 level with radiculopathy: Secondary | ICD-10-CM | POA: Diagnosis not present

## 2022-03-25 DIAGNOSIS — M5384 Other specified dorsopathies, thoracic region: Secondary | ICD-10-CM | POA: Diagnosis not present

## 2022-03-25 DIAGNOSIS — M9902 Segmental and somatic dysfunction of thoracic region: Secondary | ICD-10-CM | POA: Diagnosis not present

## 2022-03-25 DIAGNOSIS — M9901 Segmental and somatic dysfunction of cervical region: Secondary | ICD-10-CM | POA: Diagnosis not present

## 2022-03-30 DIAGNOSIS — M9901 Segmental and somatic dysfunction of cervical region: Secondary | ICD-10-CM | POA: Diagnosis not present

## 2022-03-30 DIAGNOSIS — M5384 Other specified dorsopathies, thoracic region: Secondary | ICD-10-CM | POA: Diagnosis not present

## 2022-03-30 DIAGNOSIS — M50122 Cervical disc disorder at C5-C6 level with radiculopathy: Secondary | ICD-10-CM | POA: Diagnosis not present

## 2022-03-30 DIAGNOSIS — M9902 Segmental and somatic dysfunction of thoracic region: Secondary | ICD-10-CM | POA: Diagnosis not present

## 2022-04-02 DIAGNOSIS — M5384 Other specified dorsopathies, thoracic region: Secondary | ICD-10-CM | POA: Diagnosis not present

## 2022-04-02 DIAGNOSIS — M9902 Segmental and somatic dysfunction of thoracic region: Secondary | ICD-10-CM | POA: Diagnosis not present

## 2022-04-02 DIAGNOSIS — M9901 Segmental and somatic dysfunction of cervical region: Secondary | ICD-10-CM | POA: Diagnosis not present

## 2022-04-02 DIAGNOSIS — M50122 Cervical disc disorder at C5-C6 level with radiculopathy: Secondary | ICD-10-CM | POA: Diagnosis not present

## 2022-04-06 DIAGNOSIS — M5384 Other specified dorsopathies, thoracic region: Secondary | ICD-10-CM | POA: Diagnosis not present

## 2022-04-06 DIAGNOSIS — M9902 Segmental and somatic dysfunction of thoracic region: Secondary | ICD-10-CM | POA: Diagnosis not present

## 2022-04-06 DIAGNOSIS — M50122 Cervical disc disorder at C5-C6 level with radiculopathy: Secondary | ICD-10-CM | POA: Diagnosis not present

## 2022-04-06 DIAGNOSIS — M9901 Segmental and somatic dysfunction of cervical region: Secondary | ICD-10-CM | POA: Diagnosis not present

## 2022-04-09 DIAGNOSIS — M50122 Cervical disc disorder at C5-C6 level with radiculopathy: Secondary | ICD-10-CM | POA: Diagnosis not present

## 2022-04-09 DIAGNOSIS — M5384 Other specified dorsopathies, thoracic region: Secondary | ICD-10-CM | POA: Diagnosis not present

## 2022-04-09 DIAGNOSIS — M9901 Segmental and somatic dysfunction of cervical region: Secondary | ICD-10-CM | POA: Diagnosis not present

## 2022-04-09 DIAGNOSIS — M9902 Segmental and somatic dysfunction of thoracic region: Secondary | ICD-10-CM | POA: Diagnosis not present

## 2022-04-21 DIAGNOSIS — M9901 Segmental and somatic dysfunction of cervical region: Secondary | ICD-10-CM | POA: Diagnosis not present

## 2022-04-21 DIAGNOSIS — M50122 Cervical disc disorder at C5-C6 level with radiculopathy: Secondary | ICD-10-CM | POA: Diagnosis not present

## 2022-04-21 DIAGNOSIS — M9902 Segmental and somatic dysfunction of thoracic region: Secondary | ICD-10-CM | POA: Diagnosis not present

## 2022-04-21 DIAGNOSIS — M5384 Other specified dorsopathies, thoracic region: Secondary | ICD-10-CM | POA: Diagnosis not present

## 2022-04-27 DIAGNOSIS — M5384 Other specified dorsopathies, thoracic region: Secondary | ICD-10-CM | POA: Diagnosis not present

## 2022-04-27 DIAGNOSIS — M9901 Segmental and somatic dysfunction of cervical region: Secondary | ICD-10-CM | POA: Diagnosis not present

## 2022-04-27 DIAGNOSIS — M9902 Segmental and somatic dysfunction of thoracic region: Secondary | ICD-10-CM | POA: Diagnosis not present

## 2022-04-27 DIAGNOSIS — M50122 Cervical disc disorder at C5-C6 level with radiculopathy: Secondary | ICD-10-CM | POA: Diagnosis not present

## 2022-05-08 DIAGNOSIS — H8110 Benign paroxysmal vertigo, unspecified ear: Secondary | ICD-10-CM | POA: Diagnosis not present

## 2022-05-14 DIAGNOSIS — M25511 Pain in right shoulder: Secondary | ICD-10-CM | POA: Diagnosis not present

## 2022-05-28 DIAGNOSIS — H52203 Unspecified astigmatism, bilateral: Secondary | ICD-10-CM | POA: Diagnosis not present

## 2022-05-28 DIAGNOSIS — H5203 Hypermetropia, bilateral: Secondary | ICD-10-CM | POA: Diagnosis not present

## 2022-05-28 DIAGNOSIS — D352 Benign neoplasm of pituitary gland: Secondary | ICD-10-CM | POA: Diagnosis not present

## 2022-05-30 DIAGNOSIS — H6983 Other specified disorders of Eustachian tube, bilateral: Secondary | ICD-10-CM | POA: Diagnosis not present

## 2022-05-30 DIAGNOSIS — H8110 Benign paroxysmal vertigo, unspecified ear: Secondary | ICD-10-CM | POA: Diagnosis not present

## 2022-06-04 DIAGNOSIS — H8113 Benign paroxysmal vertigo, bilateral: Secondary | ICD-10-CM | POA: Diagnosis not present

## 2022-07-03 DIAGNOSIS — R42 Dizziness and giddiness: Secondary | ICD-10-CM | POA: Diagnosis not present

## 2022-07-03 DIAGNOSIS — H903 Sensorineural hearing loss, bilateral: Secondary | ICD-10-CM | POA: Diagnosis not present

## 2022-08-13 DIAGNOSIS — I10 Essential (primary) hypertension: Secondary | ICD-10-CM | POA: Diagnosis not present

## 2022-08-19 DIAGNOSIS — D353 Benign neoplasm of craniopharyngeal duct: Secondary | ICD-10-CM | POA: Diagnosis not present

## 2022-08-20 DIAGNOSIS — I10 Essential (primary) hypertension: Secondary | ICD-10-CM | POA: Diagnosis not present

## 2022-08-20 DIAGNOSIS — Z0000 Encounter for general adult medical examination without abnormal findings: Secondary | ICD-10-CM | POA: Diagnosis not present

## 2022-08-20 DIAGNOSIS — Z23 Encounter for immunization: Secondary | ICD-10-CM | POA: Diagnosis not present

## 2022-08-20 DIAGNOSIS — R82998 Other abnormal findings in urine: Secondary | ICD-10-CM | POA: Diagnosis not present

## 2022-08-20 DIAGNOSIS — D352 Benign neoplasm of pituitary gland: Secondary | ICD-10-CM | POA: Diagnosis not present

## 2022-09-14 DIAGNOSIS — Z01419 Encounter for gynecological examination (general) (routine) without abnormal findings: Secondary | ICD-10-CM | POA: Diagnosis not present

## 2022-09-14 DIAGNOSIS — N951 Menopausal and female climacteric states: Secondary | ICD-10-CM | POA: Diagnosis not present

## 2022-09-14 DIAGNOSIS — Z6838 Body mass index (BMI) 38.0-38.9, adult: Secondary | ICD-10-CM | POA: Diagnosis not present

## 2022-09-14 DIAGNOSIS — Z1231 Encounter for screening mammogram for malignant neoplasm of breast: Secondary | ICD-10-CM | POA: Diagnosis not present

## 2023-07-24 DIAGNOSIS — Z23 Encounter for immunization: Secondary | ICD-10-CM | POA: Diagnosis not present

## 2023-08-18 DIAGNOSIS — D224 Melanocytic nevi of scalp and neck: Secondary | ICD-10-CM | POA: Diagnosis not present

## 2023-08-18 DIAGNOSIS — L7211 Pilar cyst: Secondary | ICD-10-CM | POA: Diagnosis not present

## 2023-08-19 DIAGNOSIS — M9902 Segmental and somatic dysfunction of thoracic region: Secondary | ICD-10-CM | POA: Diagnosis not present

## 2023-08-19 DIAGNOSIS — M50322 Other cervical disc degeneration at C5-C6 level: Secondary | ICD-10-CM | POA: Diagnosis not present

## 2023-08-19 DIAGNOSIS — M9901 Segmental and somatic dysfunction of cervical region: Secondary | ICD-10-CM | POA: Diagnosis not present

## 2023-08-19 DIAGNOSIS — M5384 Other specified dorsopathies, thoracic region: Secondary | ICD-10-CM | POA: Diagnosis not present

## 2023-08-23 DIAGNOSIS — M9902 Segmental and somatic dysfunction of thoracic region: Secondary | ICD-10-CM | POA: Diagnosis not present

## 2023-08-23 DIAGNOSIS — M5384 Other specified dorsopathies, thoracic region: Secondary | ICD-10-CM | POA: Diagnosis not present

## 2023-08-23 DIAGNOSIS — M50322 Other cervical disc degeneration at C5-C6 level: Secondary | ICD-10-CM | POA: Diagnosis not present

## 2023-08-23 DIAGNOSIS — M9901 Segmental and somatic dysfunction of cervical region: Secondary | ICD-10-CM | POA: Diagnosis not present

## 2023-08-25 DIAGNOSIS — Z1212 Encounter for screening for malignant neoplasm of rectum: Secondary | ICD-10-CM | POA: Diagnosis not present

## 2023-08-25 DIAGNOSIS — E039 Hypothyroidism, unspecified: Secondary | ICD-10-CM | POA: Diagnosis not present

## 2023-08-25 DIAGNOSIS — I10 Essential (primary) hypertension: Secondary | ICD-10-CM | POA: Diagnosis not present

## 2023-08-26 DIAGNOSIS — E221 Hyperprolactinemia: Secondary | ICD-10-CM | POA: Diagnosis not present

## 2023-08-26 DIAGNOSIS — M50322 Other cervical disc degeneration at C5-C6 level: Secondary | ICD-10-CM | POA: Diagnosis not present

## 2023-08-26 DIAGNOSIS — M9902 Segmental and somatic dysfunction of thoracic region: Secondary | ICD-10-CM | POA: Diagnosis not present

## 2023-08-26 DIAGNOSIS — M9901 Segmental and somatic dysfunction of cervical region: Secondary | ICD-10-CM | POA: Diagnosis not present

## 2023-08-26 DIAGNOSIS — M5384 Other specified dorsopathies, thoracic region: Secondary | ICD-10-CM | POA: Diagnosis not present

## 2023-08-31 DIAGNOSIS — M50322 Other cervical disc degeneration at C5-C6 level: Secondary | ICD-10-CM | POA: Diagnosis not present

## 2023-08-31 DIAGNOSIS — M9901 Segmental and somatic dysfunction of cervical region: Secondary | ICD-10-CM | POA: Diagnosis not present

## 2023-08-31 DIAGNOSIS — M9902 Segmental and somatic dysfunction of thoracic region: Secondary | ICD-10-CM | POA: Diagnosis not present

## 2023-08-31 DIAGNOSIS — M5384 Other specified dorsopathies, thoracic region: Secondary | ICD-10-CM | POA: Diagnosis not present

## 2023-09-01 DIAGNOSIS — Z0000 Encounter for general adult medical examination without abnormal findings: Secondary | ICD-10-CM | POA: Diagnosis not present

## 2023-09-01 DIAGNOSIS — Z1331 Encounter for screening for depression: Secondary | ICD-10-CM | POA: Diagnosis not present

## 2023-09-01 DIAGNOSIS — Z1339 Encounter for screening examination for other mental health and behavioral disorders: Secondary | ICD-10-CM | POA: Diagnosis not present

## 2023-09-01 DIAGNOSIS — I10 Essential (primary) hypertension: Secondary | ICD-10-CM | POA: Diagnosis not present

## 2023-09-01 DIAGNOSIS — R82998 Other abnormal findings in urine: Secondary | ICD-10-CM | POA: Diagnosis not present

## 2023-10-14 DIAGNOSIS — D224 Melanocytic nevi of scalp and neck: Secondary | ICD-10-CM | POA: Diagnosis not present

## 2023-10-14 DIAGNOSIS — L7211 Pilar cyst: Secondary | ICD-10-CM | POA: Diagnosis not present

## 2023-11-16 DIAGNOSIS — D171 Benign lipomatous neoplasm of skin and subcutaneous tissue of trunk: Secondary | ICD-10-CM | POA: Diagnosis not present

## 2023-11-17 ENCOUNTER — Encounter: Payer: Self-pay | Admitting: Internal Medicine

## 2024-03-12 ENCOUNTER — Encounter (HOSPITAL_BASED_OUTPATIENT_CLINIC_OR_DEPARTMENT_OTHER): Payer: Self-pay

## 2024-03-12 ENCOUNTER — Emergency Department (HOSPITAL_COMMUNITY)

## 2024-03-12 ENCOUNTER — Emergency Department (HOSPITAL_BASED_OUTPATIENT_CLINIC_OR_DEPARTMENT_OTHER)

## 2024-03-12 ENCOUNTER — Observation Stay (HOSPITAL_BASED_OUTPATIENT_CLINIC_OR_DEPARTMENT_OTHER)
Admission: EM | Admit: 2024-03-12 | Discharge: 2024-03-13 | Disposition: A | Discharge status: Home / Self Care | Attending: General Surgery | Admitting: General Surgery

## 2024-03-12 ENCOUNTER — Other Ambulatory Visit: Payer: Self-pay

## 2024-03-12 DIAGNOSIS — K819 Cholecystitis, unspecified: Secondary | ICD-10-CM | POA: Diagnosis not present

## 2024-03-12 DIAGNOSIS — K8020 Calculus of gallbladder without cholecystitis without obstruction: Secondary | ICD-10-CM | POA: Diagnosis not present

## 2024-03-12 DIAGNOSIS — E039 Hypothyroidism, unspecified: Secondary | ICD-10-CM | POA: Diagnosis not present

## 2024-03-12 DIAGNOSIS — K810 Acute cholecystitis: Secondary | ICD-10-CM | POA: Diagnosis not present

## 2024-03-12 DIAGNOSIS — I10 Essential (primary) hypertension: Secondary | ICD-10-CM | POA: Diagnosis not present

## 2024-03-12 DIAGNOSIS — Z79899 Other long term (current) drug therapy: Secondary | ICD-10-CM | POA: Diagnosis not present

## 2024-03-12 DIAGNOSIS — K8012 Calculus of gallbladder with acute and chronic cholecystitis without obstruction: Principal | ICD-10-CM | POA: Insufficient documentation

## 2024-03-12 DIAGNOSIS — Z789 Other specified health status: Secondary | ICD-10-CM | POA: Diagnosis not present

## 2024-03-12 DIAGNOSIS — R1011 Right upper quadrant pain: Secondary | ICD-10-CM | POA: Diagnosis not present

## 2024-03-12 DIAGNOSIS — R1084 Generalized abdominal pain: Secondary | ICD-10-CM | POA: Diagnosis not present

## 2024-03-12 LAB — COMPREHENSIVE METABOLIC PANEL WITH GFR
ALT: 27 U/L (ref 0–44)
AST: 26 U/L (ref 15–41)
Albumin: 4.5 g/dL (ref 3.5–5.0)
Alkaline Phosphatase: 71 U/L (ref 38–126)
Anion gap: 12 (ref 5–15)
BUN: 13 mg/dL (ref 6–20)
CO2: 25 mmol/L (ref 22–32)
Calcium: 9.8 mg/dL (ref 8.9–10.3)
Chloride: 102 mmol/L (ref 98–111)
Creatinine, Ser: 0.69 mg/dL (ref 0.44–1.00)
GFR, Estimated: >60 mL/min (ref >60)
Glucose, Bld: 105 mg/dL — ABNORMAL HIGH (ref 70–99)
Potassium: 3.6 mmol/L (ref 3.5–5.1)
Sodium: 139 mmol/L (ref 135–145)
Total Bilirubin: 0.4 mg/dL (ref 0.0–1.2)
Total Protein: 7.2 g/dL (ref 6.5–8.1)

## 2024-03-12 LAB — URINALYSIS, ROUTINE W REFLEX MICROSCOPIC
Bilirubin Urine: NEGATIVE
Glucose, UA: NEGATIVE mg/dL
Hgb urine dipstick: NEGATIVE
Ketones, ur: NEGATIVE mg/dL
Leukocytes,Ua: NEGATIVE
Nitrite: NEGATIVE
Protein, ur: NEGATIVE mg/dL
Specific Gravity, Urine: 1.005 (ref 1.005–1.030)
pH: 5.5 (ref 5.0–8.0)

## 2024-03-12 LAB — CBC
HCT: 38.9 % (ref 36.0–46.0)
Hemoglobin: 13.1 g/dL (ref 12.0–15.0)
MCH: 28.9 pg (ref 26.0–34.0)
MCHC: 33.7 g/dL (ref 30.0–36.0)
MCV: 85.7 fL (ref 80.0–100.0)
Platelets: 283 10*3/uL (ref 150–400)
RBC: 4.54 MIL/uL (ref 3.87–5.11)
RDW: 12.7 % (ref 11.5–15.5)
WBC: 9.5 10*3/uL (ref 4.0–10.5)
nRBC: 0 % (ref 0.0–0.2)

## 2024-03-12 LAB — TROPONIN T, HIGH SENSITIVITY: Troponin T High Sensitivity: <15 ng/L (ref <19)

## 2024-03-12 LAB — PREGNANCY, URINE: Preg Test, Ur: NEGATIVE

## 2024-03-12 LAB — LIPASE, BLOOD: Lipase: 19 U/L (ref 11–51)

## 2024-03-12 MED ORDER — MORPHINE SULFATE (PF) 4 MG/ML IV SOLN
4.0000 mg | Freq: Once | INTRAVENOUS | Status: AC
  Administered 2024-03-12: 4 mg via INTRAVENOUS
  Filled 2024-03-12: qty 1

## 2024-03-12 MED ORDER — ACETAMINOPHEN 325 MG PO TABS
650.0000 mg | ORAL_TABLET | Freq: Four times a day (QID) | ORAL | Status: DC | PRN
Start: 2024-03-12 — End: 2024-03-12

## 2024-03-12 MED ORDER — SODIUM CHLORIDE 0.9 % IV SOLN: INTRAVENOUS | Status: DC

## 2024-03-12 MED ORDER — MORPHINE SULFATE (PF) 4 MG/ML IV SOLN: 4.0000 mg | Freq: Once | INTRAVENOUS | Status: DC

## 2024-03-12 MED ORDER — ACETAMINOPHEN 650 MG RE SUPP: 650.0000 mg | Freq: Four times a day (QID) | RECTAL | Status: DC | PRN

## 2024-03-12 MED ORDER — ONDANSETRON HCL 4 MG/2ML IJ SOLN
4.0000 mg | Freq: Once | INTRAMUSCULAR | Status: AC
  Administered 2024-03-12: 4 mg via INTRAVENOUS
  Filled 2024-03-12: qty 2

## 2024-03-12 MED ORDER — HYDROMORPHONE HCL 1 MG/ML IJ SOLN
0.5000 mg | INTRAMUSCULAR | Status: DC | PRN
  Administered 2024-03-13: 0.5 mg via INTRAVENOUS
  Filled 2024-03-12: qty 0.5

## 2024-03-12 MED ORDER — OXYCODONE HCL 5 MG PO TABS
5.0000 mg | ORAL_TABLET | ORAL | Status: DC | PRN
  Administered 2024-03-12 – 2024-03-13 (×2): 5 mg via ORAL
  Filled 2024-03-12 (×2): qty 1

## 2024-03-12 MED ORDER — SODIUM CHLORIDE 0.9 % IV SOLN
2.0000 g | INTRAVENOUS | Status: DC
  Administered 2024-03-12: 2 g via INTRAVENOUS
  Filled 2024-03-12: qty 20

## 2024-03-12 MED ORDER — ONDANSETRON HCL 4 MG/2ML IJ SOLN
4.0000 mg | Freq: Four times a day (QID) | INTRAMUSCULAR | Status: DC | PRN
  Administered 2024-03-13: 4 mg via INTRAVENOUS
  Filled 2024-03-12 (×2): qty 2

## 2024-03-12 MED ORDER — ENOXAPARIN SODIUM 40 MG/0.4ML IJ SOSY
40.0000 mg | PREFILLED_SYRINGE | INTRAMUSCULAR | Status: DC
  Administered 2024-03-12: 40 mg via SUBCUTANEOUS
  Filled 2024-03-12: qty 0.4

## 2024-03-12 MED ORDER — SODIUM CHLORIDE 0.9 % IV SOLN: 2.0000 g | Freq: Once | INTRAVENOUS | Status: DC

## 2024-03-12 MED ORDER — ONDANSETRON 4 MG PO TBDP: 4.0000 mg | ORAL_TABLET | Freq: Four times a day (QID) | ORAL | Status: DC | PRN

## 2024-03-12 NOTE — ED Notes (Signed)
 [facility] called for transport

## 2024-03-12 NOTE — ED Provider Notes (Signed)
 [facility] EMERGENCY DEPARTMENT AT [facility] Provider Note   CSN: [id number] Arrival date & time: 03/12/24  1349     History Chief Complaint  Patient presents with   Abdominal Pain    Danielle Burnett is a 53 y.o. female with history of hypertension, hyperlipidemia, hypothyroidism presents to the emerged part today for evaluation of intermittent right upper quadrant pain radiating to right flank.  She reports it is a squeezing throbbing sensation.  Has been having intermittent episodes since January.  Reports it been more frequently over the past 2 months.  Usually has symptoms that last around 6 hours but then subside.  She reports that she had had her last episode a few days ago after eating a pie that had [employer] she can convince milk in it.  She reports that yesterday she had a [place] and cheese sandwich but also had shots including [employer] and started having pain around 8:00 last night.  Has had continued pain since then.  She denies any fever, chest pain, shortness of breath, dysuria, hematuria, melena, hematochezia.  Last doses of Advil  was at 0800.  Tylenol  was at 0630 today.  She has not followed up with her primary care doctor on this or a GI specialist.  Abdominal surgeries include 2 C-sections and a uterine ablation.  Allergic to Cipro and Flagyl.  Denies any tobacco, EtOH illicit drug use.   Abdominal Pain Associated symptoms: nausea   Associated symptoms: no chest pain, no chills, no constipation, no cough, no diarrhea, no dysuria, no fever, no hematuria, no shortness of breath and no vomiting        Home Medications Prior to Admission medications   Medication Sig Start Date End Date Taking? Authorizing Provider  ezetimibe-simvastatin (VYTORIN) 10-20 MG tablet Take 1 tablet daily by mouth. 07/28/17  Yes [provider]  levothyroxine  (SYNTHROID , LEVOTHROID) 100 MCG tablet Take 125 mcg by mouth daily. 07/28/17  Yes [provider]  loratadine  (CLARITIN) 10 MG tablet Take 10 mg by mouth daily as needed. allergies   Yes [provider]  mometasone (NASONEX) 50 MCG/ACT nasal spray Place 1 spray into the nose daily. 05/15/21  Yes [provider]  montelukast (SINGULAIR) 10 MG tablet Take 1 tablet daily by mouth. 07/28/17  Yes [provider]  olmesartan (BENICAR) 20 MG tablet Take 2 tablets by mouth daily. 07/28/17  Yes [provider]  ezetimibe (ZETIA) 10 MG tablet Take 10 mg by mouth daily. 04/26/21   [provider]      Allergies    Ciprofloxacin and Flagyl [metronidazole]    Review of Systems   Review of Systems  Constitutional:  Negative for chills and fever.  Respiratory:  Negative for cough and shortness of breath.   Cardiovascular:  Negative for chest pain.  Gastrointestinal:  Positive for abdominal pain and nausea. Negative for blood in stool, constipation, diarrhea and vomiting.  Genitourinary:  Negative for dysuria, flank pain, frequency, hematuria and urgency.    Physical Exam Updated Vital Signs BP (!) 168/86 (BP Location: Right Arm)   Pulse 70   Temp 98.5 F (36.9 C) (Oral)   Resp 18   Ht 5\' 3"  (1.6 m)   Wt 95.3 kg   SpO2 99%   BMI 37.20 kg/m  Physical Exam Vitals and nursing note reviewed.  Constitutional:      General: She is not in acute distress.    Appearance: She is not ill-appearing or toxic-appearing.  Eyes:  General: No scleral icterus. Cardiovascular:     Rate and Rhythm: Normal rate.  Pulmonary:     Effort: Pulmonary effort is normal. No respiratory distress.  Abdominal:     General: There is no distension.     Palpations: Abdomen is soft.     Tenderness: There is abdominal tenderness in the right upper quadrant. There is no guarding or rebound.  Skin:    General: Skin is warm and dry.  Neurological:     Mental Status: She is alert.     ED Results / Procedures / Treatments   Labs (all labs ordered are listed, but only abnormal  results are displayed) Labs Reviewed  COMPREHENSIVE METABOLIC PANEL WITH GFR - Abnormal; Notable for the following components:      Result Value   Glucose, Bld 105 (*)    All other components within normal limits  URINALYSIS, ROUTINE W REFLEX MICROSCOPIC - Abnormal; Notable for the following components:   Color, Urine COLORLESS (*)    All other components within normal limits  LIPASE, BLOOD  CBC  PREGNANCY, URINE  TROPONIN T, HIGH SENSITIVITY    EKG None  Radiology US  Abdomen Limited RUQ (LIVER/GB) Result Date: 03/12/2024 CLINICAL DATA:  151471 RUQ pain 151471 EXAM: ULTRASOUND ABDOMEN LIMITED RIGHT UPPER QUADRANT COMPARISON:  Feb 13, 2021 FINDINGS: Gallbladder: Layering biliary sludge with multiple large stones. 1.5 cm stone in the gallbladder neck. Mild wall thickening along the gallbladder fundus. Positive sonographic Abigail Abler sign reported by the sonographer. Common bile duct: Diameter: 3 mm Liver: Increased echogenicity. No focal lesion identified. No intrahepatic biliary ductal dilation. Portal vein is patent on color Doppler imaging with normal direction of blood flow towards the liver. Other: None. IMPRESSION: 1. Layering biliary sludge with multiple large stones. 1.5 cm gallstone in the gallbladder neck. Minimal wall thickening in the gallbladder fundus measuring 4 mm with a positive sonographic Murphy's sign reported by the sonographer. This constellation of findings is concerning for early acute cholecystitis. A nuclear medicine hepatobiliary scan could be considered for confirmation. 2. Mild hepatomegaly with diffuse hepatic steatosis. Electronically Signed   By: Rance Burrows M.D.   On: 03/12/2024 16:38    Procedures Procedures   Medications Ordered in ED Medications  morphine (PF) 4 MG/ML injection 4 mg (4 mg Intravenous Given 03/12/24 1505)  ondansetron  (ZOFRAN ) injection 4 mg (4 mg Intravenous Given 03/12/24 1506)    ED Course/ Medical Decision Making/ A&P                                Medical Decision Making Amount and/or Complexity of Data Reviewed Labs: ordered. Radiology: ordered.  Risk Prescription drug management. Decision regarding hospitalization.   53 y.o. female presents to the ER for evaluation of RUQ pain. Differential diagnosis includes but is not limited to PUD, gastritis, pancreatitis, gastroparesis, malignancy, biliary disease, ACS, pericarditis, pneumonia, intestinal ischemia, hepatitis. Vital signs elevated BP, otherwise unremarkable. Physical exam as noted above.   Will order right upper quadrant ultrasound and labs.  Will add on troponin given patient is female in middle-age could be atypical presentation of MI.  She denies any chest pain or shortness of breath.  I independently reviewed and interpreted the patient's labs.  CBC without leukocytosis or anemia.  CMP shows mildly elevated glucose at 105 otherwise no electrolyte or LFT abnormality.  Urinalysis colorless urine otherwise unremarkable.  Negative pregnancy.  Lipase within normal limits.  Troponin < 15.  US  RUQ 1. Layering biliary sludge with multiple large stones. 1.5 cm gallstone in the gallbladder neck. Minimal wall thickening in the gallbladder fundus measuring 4 mm with a positive sonographic Murphy's sign reported by the sonographer. This constellation of findings is concerning for early acute cholecystitis. A nuclear medicine hepatobiliary scan could be considered for confirmation. 2. Mild hepatomegaly with diffuse hepatic steatosis. Per radiologist's interpretation.    Given imaging findings, consultation out to general surgery made.  Patient's liver function tests are within normal limits.  Normal normal total bili.  Normal lipase.  No leukocytosis.  Afebrile.  Does not meet any SIRS criteria.  Spoke with Dr. Delane Fear. He will admit the patient under general surgery service.  Requesting antibiotics.  Will need to be transferred to [facility] for surgery tomorrow.  I have  ordered the patient Rocephin and additional dose of pain medication.  She was made aware of the plan for transfer over to [facility] for surgery tomorrow.  [facility] contacted and will arrange transportation.  Patient transferred over to [facility].  Portions of this report may have been transcribed using voice recognition software. Every effort was made to ensure accuracy; however, inadvertent computerized transcription errors may be present.    Final Clinical Impression(s) / ED Diagnoses Final diagnoses:  Cholecystitis    Rx / DC Orders ED Discharge Orders     None         Spence Dux, PA-C 03/12/24 1852    Hershel Los, MD 03/13/24 1500

## 2024-03-12 NOTE — H&P (Signed)
 Danielle Burnett is an 53 y.o. female.   Chief Complaint: ab pain HPI: 12 yof with htn who presents with intermittent ab pain that today has occurred in ruq and has been present for over 12 hours and not going away. No fevers.  she was evaluated at outside er. Found to have normal wbc, normal lipase and lfts.  He has an US  that shows gallstones and evidence of cholecystitis.    Past Medical History:  Diagnosis Date   Anemia    Hyperlipidemia    Hypertension    Hypothyroid     Past Surgical History:  Procedure Laterality Date   CESAREAN SECTION     ENDOMETRIAL ABLATION     KIDNEY STONE SURGERY     ORIF ANKLE FRACTURE  10/15/2011   Procedure: OPEN REDUCTION INTERNAL FIXATION (ORIF) ANKLE FRACTURE;  Surgeon: Loel Ring;  Location: [facility];  Service: Orthopedics;  Laterality: Right;    Family History  Problem Relation Age of Onset   Heart disease Father    Colon cancer Paternal Grandfather    Social History:  reports that she has never smoked. She has never used smokeless tobacco. She reports that she does not drink alcohol  and does not use drugs.  Allergies:  Allergies  Allergen Reactions   Ciprofloxacin Rash   Flagyl [Metronidazole] Rash   Current Facility-Administered Medications  Medication Dose Route Frequency Provider Last Rate Last Admin   0.9 %  sodium chloride  infusion   Intravenous Continuous Enid Harry, MD       acetaminophen  (TYLENOL ) tablet 650 mg  650 mg Oral Q6H PRN Enid Harry, MD       Or   acetaminophen  (TYLENOL ) suppository 650 mg  650 mg Rectal Q6H PRN Enid Harry, MD       cefTRIAXone (ROCEPHIN) 2 g in sodium chloride  0.9 % 100 mL IVPB  2 g Intravenous Once Spence Dux, PA-C       enoxaparin (LOVENOX) injection 40 mg  40 mg Subcutaneous Q24H Enid Harry, MD       HYDROmorphone  (DILAUDID ) injection 0.5 mg  0.5 mg Intravenous Q2H PRN Enid Harry, MD       ondansetron  (ZOFRAN -ODT) disintegrating tablet 4 mg  4 mg Oral Q6H  PRN Enid Harry, MD       Or   ondansetron  (ZOFRAN ) injection 4 mg  4 mg Intravenous Q6H PRN Enid Harry, MD       oxyCODONE  (Oxy IR/ROXICODONE ) immediate release tablet 5-10 mg  5-10 mg Oral Q4H PRN Enid Harry, MD       Current Outpatient Medications  Medication Sig Dispense Refill   ezetimibe-simvastatin (VYTORIN) 10-20 MG tablet Take 1 tablet daily by mouth.  2   levothyroxine  (SYNTHROID , LEVOTHROID) 100 MCG tablet Take 125 mcg by mouth daily.  2   loratadine (CLARITIN) 10 MG tablet Take 10 mg by mouth daily as needed. allergies     mometasone (NASONEX) 50 MCG/ACT nasal spray Place 1 spray into the nose daily.     montelukast (SINGULAIR) 10 MG tablet Take 1 tablet daily by mouth.  3   olmesartan (BENICAR) 20 MG tablet Take 2 tablets by mouth daily.  4   ezetimibe (ZETIA) 10 MG tablet Take 10 mg by mouth daily.       Results for orders placed or performed during the hospital encounter of 03/12/24 (from the past 48 hours)  Lipase, blood     Status: None   Collection Time: 03/12/24  2:30 PM  Result  Value Ref Range   Lipase 19 11 - 51 U/L    Comment: Performed at [facility], [address], [place], [place] [address]  Comprehensive metabolic panel     Status: Abnormal   Collection Time: 03/12/24  2:30 PM  Result Value Ref Range   Sodium 139 135 - 145 mmol/L   Potassium 3.6 3.5 - 5.1 mmol/L   Chloride 102 98 - 111 mmol/L   CO2 25 22 - 32 mmol/L   Glucose, Bld 105 (H) 70 - 99 mg/dL    Comment: Glucose reference range applies only to samples taken after fasting for at least 8 hours.   BUN 13 6 - 20 mg/dL   Creatinine, Ser 4.13 0.44 - 1.00 mg/dL   Calcium 9.8 8.9 - 24.4 mg/dL   Total Protein 7.2 6.5 - 8.1 g/dL   Albumin 4.5 3.5 - 5.0 g/dL   AST 26 15 - 41 U/L   ALT 27 0 - 44 U/L   Alkaline Phosphatase 71 38 - 126 U/L   Total Bilirubin 0.4 0.0 - 1.2 mg/dL   GFR, Estimated >01 >02 mL/min    Comment: (NOTE) Calculated using the CKD-EPI  Creatinine Equation (2021)    Anion gap 12 5 - 15    Comment: Performed at [facility], [address], [place], [place] [address]  CBC     Status: None   Collection Time: 03/12/24  2:30 PM  Result Value Ref Range   WBC 9.5 4.0 - 10.5 K/uL   RBC 4.54 3.87 - 5.11 MIL/uL   Hemoglobin 13.1 12.0 - 15.0 g/dL   HCT 64.4 03.4 - 74.2 %   MCV 85.7 80.0 - 100.0 fL   MCH 28.9 26.0 - 34.0 pg   MCHC 33.7 30.0 - 36.0 g/dL   RDW 59.5 63.8 - 75.6 %   Platelets 283 150 - 400 K/uL   nRBC 0.0 0.0 - 0.2 %    Comment: Performed at [facility], [address], [place], [place] [address]  Urinalysis, Routine w reflex microscopic -Urine, Clean Catch     Status: Abnormal   Collection Time: 03/12/24  2:30 PM  Result Value Ref Range   Color, Urine COLORLESS (A) YELLOW   APPearance CLEAR CLEAR   Specific Gravity, Urine 1.005 1.005 - 1.030   pH 5.5 5.0 - 8.0   Glucose, UA NEGATIVE NEGATIVE mg/dL   Hgb urine dipstick NEGATIVE NEGATIVE   Bilirubin Urine NEGATIVE NEGATIVE   Ketones, ur NEGATIVE NEGATIVE mg/dL   Protein, ur NEGATIVE NEGATIVE mg/dL   Nitrite NEGATIVE NEGATIVE   Leukocytes,Ua NEGATIVE NEGATIVE    Comment: Performed at [facility], [address], [place], [place] [address]  Pregnancy, urine     Status: None   Collection Time: 03/12/24  2:30 PM  Result Value Ref Range   Preg Test, Ur NEGATIVE NEGATIVE    Comment:        THE SENSITIVITY OF THIS METHODOLOGY IS >25 mIU/mL. Performed at [facility], [address], [place], [place] [address]   Troponin T, High Sensitivity     Status: None   Collection Time: 03/12/24  2:30 PM  Result Value Ref Range   Troponin T High Sensitivity <15 <19 ng/L    Comment: (NOTE) Biotin concentrations > 1000 ng/mL falsely decrease TnT results.  Serial cardiac troponin measurements are suggested.  Refer to the Links section for chest pain algorithms and additional   guidance. Performed at [facility], [address] [place], [place] [address]    US  Abdomen Limited RUQ (LIVER/GB) Result Date: 03/12/2024 CLINICAL DATA:  [id number] RUQ pain 151471 EXAM: ULTRASOUND ABDOMEN LIMITED RIGHT UPPER QUADRANT COMPARISON:  Feb 13, 2021 FINDINGS: Gallbladder: Layering biliary sludge with multiple large stones. 1.5 cm stone in the gallbladder neck. Mild wall thickening along the gallbladder fundus. Positive sonographic Abigail Abler sign reported by the sonographer. Common bile duct: Diameter: 3 mm Liver: Increased echogenicity. No focal lesion identified. No intrahepatic biliary ductal dilation. Portal vein is patent on color Doppler imaging with normal direction of blood flow towards the liver. Other: None. IMPRESSION: 1. Layering biliary sludge with multiple large stones. 1.5 cm gallstone in the gallbladder neck. Minimal wall thickening in the gallbladder fundus measuring 4 mm with a positive sonographic Murphy's sign reported by the sonographer. This constellation of findings is concerning for early acute cholecystitis. A nuclear medicine hepatobiliary scan could be considered for confirmation. 2. Mild hepatomegaly with diffuse hepatic steatosis. Electronically Signed   By: Rance Burrows M.D.   On: 03/12/2024 16:38    Review of Systems  Constitutional:  Negative for fever.  Gastrointestinal:  Positive for abdominal pain.  All other systems reviewed and are negative.   Blood pressure (!) 168/86, pulse 70, temperature 98.5 F (36.9 C), temperature source Oral, resp. rate 18, height 5\' 3"  (1.6 m), weight 95.3 kg, SpO2 99%. Physical Exam Vitals reviewed.  Constitutional:      Appearance: She is well-developed.  Eyes:     General: No scleral icterus. Cardiovascular:     Rate and Rhythm: Normal rate and regular rhythm.  Pulmonary:     Effort: Pulmonary effort is normal.  Abdominal:     General: There is no distension.     Tenderness: There is abdominal  tenderness in the right upper quadrant.  Skin:    General: Skin is warm and dry.     Capillary Refill: Capillary refill takes less than 2 seconds.  Neurological:     General: No focal deficit present.     Mental Status: She is alert.      Assessment/Plan Cholecystitis -abx, npo after mn -plan for lap chole in am -I discussed the procedure in detail.  We discussed the risks and benefits of a laparoscopic cholecystectomy and possible cholangiogram including, but not limited to bleeding, infection, injury to surrounding structures such as the intestine or liver, bile leak, retained gallstones, need to convert to an open procedure, prolonged diarrhea, blood clots such as  DVT, common bile duct injury, anesthesia risks, and possible need for additional procedures.  The likelihood of improvement in symptoms and return to the patient's normal status is good. We discussed the typical post-operative recovery course.   Enid Harry, MD 03/12/2024, 5:21 PM

## 2024-03-12 NOTE — ED Triage Notes (Signed)
 In for eval of RUQ abd pain with nausea and pain radiating to back intermittent since January. This pain episode started 2000 last night. Denies vomiting and diarrhea. Last BM today and normal. Took advil  this am with slight relief.

## 2024-03-12 NOTE — Anesthesia Preprocedure Evaluation (Signed)
 Anesthesia Evaluation  Patient identified by MRN, date of birth, ID band Patient awake    Reviewed: Allergy & Precautions, NPO status , Patient's Chart, lab work & pertinent test results  History of Anesthesia Complications Negative for: history of anesthetic complications  Airway Mallampati: III  TM Distance: >3 FB Neck ROM: Full    Dental no notable dental hx. (+) Teeth Intact, Dental Advisory Given   Pulmonary    Pulmonary exam normal breath sounds clear to auscultation       Cardiovascular hypertension, Pt. on medications (-) angina (-) Past MI Normal cardiovascular exam Rhythm:Regular Rate:Normal     Neuro/Psych negative neurological ROS  negative psych ROS   GI/Hepatic negative GI ROS, Neg liver ROS,,,  Endo/Other  Hypothyroidism    Renal/GU Lab Results      Component                Value               Date                                 K                        3.6                 03/12/2024                             CREATININE               0.69                03/12/2024                        Musculoskeletal   Abdominal  (+) + obese  Peds  Hematology Lab Results      Component                Value               Date                      WBC                      9.5                 03/12/2024                HGB                      13.1                03/12/2024                HCT                      38.9                03/12/2024                MCV                      85.7  03/12/2024                PLT                      283                 03/12/2024              Anesthesia Other Findings All: Cipro Flagyl  Reproductive/Obstetrics                             Anesthesia Physical Anesthesia Plan  ASA: 2  Anesthesia Plan: General   Post-op Pain Management: Precedex, Tylenol  PO (pre-op)* and Toradol  IV (intra-op)*   Induction: Intravenous, Cricoid  pressure planned and Rapid sequence  PONV Risk Score and Plan: 4 or greater and Treatment may vary due to age or medical condition, Midazolam , Dexamethasone  and Ondansetron   Airway Management Planned: Oral ETT  Additional Equipment: None  Intra-op Plan:   Post-operative Plan: Extubation in OR  Informed Consent: I have reviewed the patients History and Physical, chart, labs and discussed the procedure including the risks, benefits and alternatives for the proposed anesthesia with the patient or authorized representative who has indicated his/her understanding and acceptance.     Dental advisory given  Plan Discussed with: CRNA and Surgeon  Anesthesia Plan Comments:        Anesthesia Quick Evaluation

## 2024-03-13 ENCOUNTER — Encounter (HOSPITAL_COMMUNITY)
Admission: EM | Disposition: A | Discharge status: Home / Self Care | Payer: Self-pay | Source: Home / Self Care | Attending: Emergency Medicine

## 2024-03-13 ENCOUNTER — Other Ambulatory Visit: Payer: Self-pay

## 2024-03-13 ENCOUNTER — Encounter (HOSPITAL_COMMUNITY): Payer: Self-pay

## 2024-03-13 ENCOUNTER — Observation Stay (HOSPITAL_COMMUNITY): Payer: Self-pay | Admitting: Anesthesiology

## 2024-03-13 DIAGNOSIS — K8010 Calculus of gallbladder with chronic cholecystitis without obstruction: Secondary | ICD-10-CM | POA: Diagnosis not present

## 2024-03-13 DIAGNOSIS — K819 Cholecystitis, unspecified: Secondary | ICD-10-CM | POA: Diagnosis not present

## 2024-03-13 HISTORY — PX: CHOLECYSTECTOMY: SHX55

## 2024-03-13 SURGERY — LAPAROSCOPIC CHOLECYSTECTOMY
Anesthesia: General | Site: Abdomen

## 2024-03-13 MED ORDER — KETOROLAC TROMETHAMINE 30 MG/ML IJ SOLN
INTRAMUSCULAR | Status: AC
  Filled 2024-03-13: qty 1

## 2024-03-13 MED ORDER — LEVOTHYROXINE SODIUM 75 MCG PO TABS: 125.0000 ug | ORAL_TABLET | Freq: Every day | ORAL | Status: DC

## 2024-03-13 MED ORDER — OXYCODONE HCL 5 MG/5ML PO SOLN: 5.0000 mg | Freq: Once | ORAL | Status: DC | PRN

## 2024-03-13 MED ORDER — TRAMADOL HCL 50 MG PO TABS: 50.0000 mg | ORAL_TABLET | Freq: Four times a day (QID) | ORAL | 0 refills | Status: DC | PRN

## 2024-03-13 MED ORDER — SUCCINYLCHOLINE CHLORIDE 200 MG/10ML IV SOSY
PREFILLED_SYRINGE | INTRAVENOUS | Status: AC
Start: 2024-03-13 — End: ?
  Filled 2024-03-13: qty 10

## 2024-03-13 MED ORDER — MORPHINE SULFATE (PF) 2 MG/ML IV SOLN
2.0000 mg | INTRAVENOUS | Status: DC | PRN
  Administered 2024-03-13: 2 mg via INTRAVENOUS
  Filled 2024-03-13: qty 1

## 2024-03-13 MED ORDER — BUPIVACAINE HCL 0.25 % IJ SOLN
INTRAMUSCULAR | Status: DC | PRN
  Administered 2024-03-13: 21 mL

## 2024-03-13 MED ORDER — EZETIMIBE-SIMVASTATIN 10-20 MG PO TABS
1.0000 | ORAL_TABLET | Freq: Every day | ORAL | Status: DC
  Filled 2024-03-13: qty 1

## 2024-03-13 MED ORDER — FENTANYL CITRATE (PF) 250 MCG/5ML IJ SOLN
INTRAMUSCULAR | Status: DC | PRN
Start: 2024-03-13 — End: 2024-03-13
  Administered 2024-03-13 (×4): 50 ug via INTRAVENOUS

## 2024-03-13 MED ORDER — EPHEDRINE 5 MG/ML INJ
INTRAVENOUS | Status: AC
  Filled 2024-03-13: qty 5

## 2024-03-13 MED ORDER — IBUPROFEN 200 MG PO TABS: 400.0000 mg | ORAL_TABLET | Freq: Four times a day (QID) | ORAL | Status: AC | PRN

## 2024-03-13 MED ORDER — DEXAMETHASONE SODIUM PHOSPHATE 10 MG/ML IJ SOLN
INTRAMUSCULAR | Status: DC | PRN
  Administered 2024-03-13: 10 mg via INTRAVENOUS

## 2024-03-13 MED ORDER — ACETAMINOPHEN 500 MG PO TABS: 1000.0000 mg | ORAL_TABLET | Freq: Three times a day (TID) | ORAL | Status: AC | PRN

## 2024-03-13 MED ORDER — LORATADINE 10 MG PO TABS: 10.0000 mg | ORAL_TABLET | Freq: Every day | ORAL | Status: DC | PRN

## 2024-03-13 MED ORDER — ONDANSETRON HCL 4 MG/2ML IJ SOLN
INTRAMUSCULAR | Status: DC | PRN
  Administered 2024-03-13: 4 mg via INTRAVENOUS

## 2024-03-13 MED ORDER — TRAMADOL HCL 50 MG PO TABS
50.0000 mg | ORAL_TABLET | Freq: Four times a day (QID) | ORAL | Status: DC | PRN
  Administered 2024-03-13: 50 mg via ORAL
  Filled 2024-03-13: qty 1

## 2024-03-13 MED ORDER — LIDOCAINE 2% (20 MG/ML) 5 ML SYRINGE
INTRAMUSCULAR | Status: DC | PRN
  Administered 2024-03-13: 100 mg via INTRAVENOUS

## 2024-03-13 MED ORDER — CHLORHEXIDINE GLUCONATE 0.12 % MT SOLN: 15.0000 mL | Freq: Once | OROMUCOSAL | Status: AC

## 2024-03-13 MED ORDER — LIDOCAINE 2% (20 MG/ML) 5 ML SYRINGE
INTRAMUSCULAR | Status: AC
  Filled 2024-03-13: qty 5

## 2024-03-13 MED ORDER — EZETIMIBE 10 MG PO TABS: 10.0000 mg | ORAL_TABLET | Freq: Every day | ORAL | Status: DC

## 2024-03-13 MED ORDER — SODIUM CHLORIDE 0.9 % IR SOLN
Status: DC | PRN
Start: 2024-03-13 — End: 2024-03-13
  Administered 2024-03-13: 1000 mL

## 2024-03-13 MED ORDER — MIDAZOLAM HCL 2 MG/2ML IJ SOLN
INTRAMUSCULAR | Status: AC
  Filled 2024-03-13: qty 2

## 2024-03-13 MED ORDER — ONDANSETRON HCL 4 MG/2ML IJ SOLN
INTRAMUSCULAR | Status: AC
  Filled 2024-03-13: qty 2

## 2024-03-13 MED ORDER — BUPIVACAINE HCL (PF) 0.25 % IJ SOLN
INTRAMUSCULAR | Status: AC
  Filled 2024-03-13: qty 30

## 2024-03-13 MED ORDER — ROCURONIUM BROMIDE 10 MG/ML (PF) SYRINGE
PREFILLED_SYRINGE | INTRAVENOUS | Status: DC | PRN
  Administered 2024-03-13: 30 mg via INTRAVENOUS

## 2024-03-13 MED ORDER — ORAL CARE MOUTH RINSE: 15.0000 mL | Freq: Once | OROMUCOSAL | Status: AC

## 2024-03-13 MED ORDER — KETOROLAC TROMETHAMINE 30 MG/ML IJ SOLN
30.0000 mg | Freq: Once | INTRAMUSCULAR | Status: DC | PRN
Start: 2024-03-13 — End: 2024-03-13

## 2024-03-13 MED ORDER — PROPOFOL 10 MG/ML IV BOLUS
INTRAVENOUS | Status: DC | PRN
  Administered 2024-03-13: 180 mg via INTRAVENOUS

## 2024-03-13 MED ORDER — SUCCINYLCHOLINE CHLORIDE 200 MG/10ML IV SOSY
PREFILLED_SYRINGE | INTRAVENOUS | Status: DC | PRN
  Administered 2024-03-13: 110 mg via INTRAVENOUS

## 2024-03-13 MED ORDER — LACTATED RINGERS IV SOLN: INTRAVENOUS | Status: DC

## 2024-03-13 MED ORDER — SUGAMMADEX SODIUM 200 MG/2ML IV SOLN
INTRAVENOUS | Status: DC | PRN
  Administered 2024-03-13: 200 mg via INTRAVENOUS

## 2024-03-13 MED ORDER — ONDANSETRON HCL 4 MG/2ML IJ SOLN
4.0000 mg | Freq: Once | INTRAMUSCULAR | Status: DC | PRN
Start: 2024-03-13 — End: 2024-03-13

## 2024-03-13 MED ORDER — 0.9 % SODIUM CHLORIDE (POUR BTL) OPTIME
TOPICAL | Status: DC | PRN
  Administered 2024-03-13: 1000 mL

## 2024-03-13 MED ORDER — PHENYLEPHRINE 80 MCG/ML (10ML) SYRINGE FOR IV PUSH (FOR BLOOD PRESSURE SUPPORT)
PREFILLED_SYRINGE | INTRAVENOUS | Status: AC
  Filled 2024-03-13: qty 10

## 2024-03-13 MED ORDER — EPHEDRINE SULFATE-NACL 50-0.9 MG/10ML-% IV SOSY
PREFILLED_SYRINGE | INTRAVENOUS | Status: DC | PRN
  Administered 2024-03-13 (×2): 5 mg via INTRAVENOUS

## 2024-03-13 MED ORDER — ACETAMINOPHEN 500 MG PO TABS
1000.0000 mg | ORAL_TABLET | ORAL | Status: AC
  Administered 2024-03-13: 1000 mg via ORAL
  Filled 2024-03-13: qty 2

## 2024-03-13 MED ORDER — IRBESARTAN 300 MG PO TABS: 150.0000 mg | ORAL_TABLET | Freq: Every day | ORAL | Status: DC

## 2024-03-13 MED ORDER — SIMVASTATIN 20 MG PO TABS: 20.0000 mg | ORAL_TABLET | Freq: Every day | ORAL | Status: DC

## 2024-03-13 MED ORDER — MIDAZOLAM HCL 2 MG/2ML IJ SOLN
INTRAMUSCULAR | Status: DC | PRN
  Administered 2024-03-13: 2 mg via INTRAVENOUS

## 2024-03-13 MED ORDER — SPY AGENT GREEN - (INDOCYANINE FOR INJECTION)
1.2500 mg | Freq: Once | INTRAMUSCULAR | Status: AC
  Administered 2024-03-13: 1.25 mg via INTRAVENOUS
  Filled 2024-03-13: qty 10

## 2024-03-13 MED ORDER — FENTANYL CITRATE (PF) 250 MCG/5ML IJ SOLN
INTRAMUSCULAR | Status: AC
  Filled 2024-03-13: qty 5

## 2024-03-13 MED ORDER — OXYCODONE HCL 5 MG PO TABS: 5.0000 mg | ORAL_TABLET | Freq: Once | ORAL | Status: DC | PRN

## 2024-03-13 MED ORDER — KETOROLAC TROMETHAMINE 30 MG/ML IJ SOLN
INTRAMUSCULAR | Status: DC | PRN
  Administered 2024-03-13: 15 mg via INTRAVENOUS

## 2024-03-13 MED ORDER — DEXMEDETOMIDINE HCL IN NACL 80 MCG/20ML IV SOLN
INTRAVENOUS | Status: DC | PRN
  Administered 2024-03-13: 8 ug via INTRAVENOUS
  Administered 2024-03-13: 12 ug via INTRAVENOUS

## 2024-03-13 MED ORDER — DEXAMETHASONE SODIUM PHOSPHATE 10 MG/ML IJ SOLN
INTRAMUSCULAR | Status: AC
  Filled 2024-03-13: qty 1

## 2024-03-13 MED ORDER — CHLORHEXIDINE GLUCONATE 0.12 % MT SOLN
OROMUCOSAL | Status: AC
Start: 2024-03-13 — End: 2024-03-13
  Administered 2024-03-13: 15 mL via OROMUCOSAL
  Filled 2024-03-13: qty 15

## 2024-03-13 MED ORDER — ROCURONIUM BROMIDE 10 MG/ML (PF) SYRINGE
PREFILLED_SYRINGE | INTRAVENOUS | Status: AC
  Filled 2024-03-13: qty 10

## 2024-03-13 MED ORDER — PROPOFOL 10 MG/ML IV BOLUS
INTRAVENOUS | Status: AC
  Filled 2024-03-13: qty 20

## 2024-03-13 MED ORDER — MONTELUKAST SODIUM 10 MG PO TABS: 10.0000 mg | ORAL_TABLET | Freq: Every day | ORAL | Status: DC

## 2024-03-13 MED ORDER — HYDROMORPHONE HCL 1 MG/ML IJ SOLN: 0.2500 mg | INTRAMUSCULAR | Status: DC | PRN

## 2024-03-13 SURGICAL SUPPLY — 36 items
BAG COUNTER SPONGE SURGICOUNT (BAG) ×1 IMPLANT
CANISTER SUCTION 3000ML PPV (SUCTIONS) ×1 IMPLANT
CHLORAPREP W/TINT 26 (MISCELLANEOUS) ×1 IMPLANT
CLIP LIGATING HEMO O LOK GREEN (MISCELLANEOUS) ×1 IMPLANT
COVER SURGICAL LIGHT HANDLE (MISCELLANEOUS) ×1 IMPLANT
COVER TRANSDUCER ULTRASND (DRAPES) ×1 IMPLANT
DERMABOND ADVANCED .7 DNX12 (GAUZE/BANDAGES/DRESSINGS) ×1 IMPLANT
ELECTRODE REM PT RTRN 9FT ADLT (ELECTROSURGICAL) ×1 IMPLANT
ENDOLOOP SUT PDS II 0 18 (SUTURE) IMPLANT
GLOVE BIO SURGEON STRL SZ7.5 (GLOVE) ×2 IMPLANT
GOWN STRL REUS W/ TWL LRG LVL3 (GOWN DISPOSABLE) ×2 IMPLANT
GOWN STRL REUS W/ TWL XL LVL3 (GOWN DISPOSABLE) ×1 IMPLANT
GRASPER SUT TROCAR 14GX15 (MISCELLANEOUS) ×1 IMPLANT
IRRIGATION SUCT STRKRFLW 2 WTP (MISCELLANEOUS) ×1 IMPLANT
KIT BASIN OR (CUSTOM PROCEDURE TRAY) ×1 IMPLANT
KIT IMAGING PINPOINTPAQ (MISCELLANEOUS) IMPLANT
KIT TURNOVER KIT B (KITS) ×1 IMPLANT
NDL INSUFFLATION 14GA 120MM (NEEDLE) ×1 IMPLANT
NEEDLE INSUFFLATION 14GA 120MM (NEEDLE) ×1 IMPLANT
NS IRRIG 1000ML POUR BTL (IV SOLUTION) ×1 IMPLANT
PAD ARMBOARD POSITIONER FOAM (MISCELLANEOUS) ×1 IMPLANT
POUCH LAPAROSCOPIC INSTRUMENT (MISCELLANEOUS) ×1 IMPLANT
POUCH RETRIEVAL ECOSAC 10 (ENDOMECHANICALS) IMPLANT
SCISSORS LAP 5X35 DISP (ENDOMECHANICALS) ×1 IMPLANT
SET TUBE SMOKE EVAC HIGH FLOW (TUBING) ×1 IMPLANT
SLEEVE Z-THREAD 5X100MM (TROCAR) ×1 IMPLANT
SPECIMEN JAR SMALL (MISCELLANEOUS) ×1 IMPLANT
SUT MNCRL AB 4-0 PS2 18 (SUTURE) ×1 IMPLANT
SUT VICRYL 0 UR6 27IN ABS (SUTURE) IMPLANT
TOWEL GREEN STERILE (TOWEL DISPOSABLE) ×1 IMPLANT
TOWEL GREEN STERILE FF (TOWEL DISPOSABLE) ×1 IMPLANT
TRAY LAPAROSCOPIC MC (CUSTOM PROCEDURE TRAY) ×1 IMPLANT
TROCAR 11X100 Z THREAD (TROCAR) ×1 IMPLANT
TROCAR Z-THREAD OPTICAL 5X100M (TROCAR) ×1 IMPLANT
WARMER LAPAROSCOPE (MISCELLANEOUS) ×1 IMPLANT
WATER STERILE IRR 1000ML POUR (IV SOLUTION) ×1 IMPLANT

## 2024-03-13 NOTE — Anesthesia Procedure Notes (Signed)
 Procedure Name: Intubation Date/Time: 03/13/2024 7:51 AM  Performed by: Viki Graver, CRNAPre-anesthesia Checklist: Patient identified, Emergency Drugs available, Suction available and Patient being monitored Patient Re-evaluated:Patient Re-evaluated prior to induction Oxygen Delivery Method: Circle System Utilized Preoxygenation: Pre-oxygenation with 100% oxygen Induction Type: IV induction Ventilation: Mask ventilation without difficulty Laryngoscope Size: Mac and 3 Grade View: Grade II Tube type: Oral Tube size: 7.0 mm Number of attempts: 1 Airway Equipment and Method: Stylet and Oral airway Placement Confirmation: ETT inserted through vocal cords under direct vision, positive ETCO2 and breath sounds checked- equal and bilateral Secured at: 21 cm Tube secured with: Tape Dental Injury: Teeth and Oropharynx as per pre-operative assessment

## 2024-03-13 NOTE — Interval H&P Note (Signed)
 History and Physical Interval Note:  03/13/2024 7:26 AM  Danielle Burnett  has presented today for surgery, with the diagnosis of CHOLECYSTITIS.  The various methods of treatment have been discussed with the patient and family. After consideration of risks, benefits and other options for treatment, the patient has consented to  Procedure(s) with comments: LAPAROSCOPIC CHOLECYSTECTOMY (N/A) - POSSIBLE INTRAOPERATIVE CHOLANGIOGRAM as a surgical intervention.  The patient's history has been reviewed, patient examined, no change in status, stable for surgery.  I have reviewed the patient's chart and labs.  Questions were answered to the patient's satisfaction.     Nasser Ku

## 2024-03-13 NOTE — Transfer of Care (Signed)
 Immediate Anesthesia Transfer of Care Note  Patient: Danielle Burnett  Procedure(s) Performed: LAPAROSCOPIC CHOLECYSTECTOMY (Abdomen)  Patient Location: PACU  Anesthesia Type:General  Level of Consciousness: oriented and drowsy  Airway & Oxygen Therapy: PATIENT SPONTANEOUSLY BREATHING  Post-op Assessment: Report given to RN and Post -op Vital signs reviewed and stable  Post vital signs: Reviewed and stable  Last Vitals:  Vitals Value Taken Time  BP 134/70 03/13/24 0900  Temp 36.4 C 03/13/24 0855  Pulse 69 03/13/24 0903  Resp 14 03/13/24 0903  SpO2 95 % 03/13/24 0903  Vitals shown include unfiled device data.  Last Pain:  Vitals:   03/13/24 0855  TempSrc:   PainSc: Asleep      Patients Stated Pain Goal: 1 (03/13/24 1610)  Complications: No notable events documented.

## 2024-03-13 NOTE — Plan of Care (Signed)

## 2024-03-13 NOTE — Op Note (Signed)
 03/13/2024  8:37 AM  PATIENT:  Danielle Burnett  53 y.o. female  PRE-OPERATIVE DIAGNOSIS:  acute CHOLECYSTITIS  POST-OPERATIVE DIAGNOSIS:  acute CHOLECYSTITIS  PROCEDURE:  Procedure(s): LAPAROSCOPIC CHOLECYSTECTOMY (N/A)  SURGEON:  Surgeons and Role:    Shela Derby, MD - Primary   ASSISTANTS: Woodroe Hazel, RNFA   ANESTHESIA:   local and general  EBL:  minimal   BLOOD ADMINISTERED:none  DRAINS: none   LOCAL MEDICATIONS USED:  BUPIVICAINE   SPECIMEN:  Source of Specimen:  gallbladder  DISPOSITION OF SPECIMEN:  PATHOLOGY  COUNTS:  YES  TOURNIQUET:  * No tourniquets in log *  DICTATION: .Dragon Dictation   EBL: <5cc   Complications: none   Counts: reported as correct x 2   Findings: acutely inflamed gallbladder, large gallstone  Indications for procedure: Pt is a 22F with RUQ pain and seen to have gallstones and acute cholecystitis  Details of the procedure:  The patient was taken to the operating and placed in the supine position with bilateral SCDs in place. A time out was called and all facts were verified. A pneumoperitoneum was obtained via A Veress needle technique to a pressure of 14mm of mercury. A 5mm trochar was then placed in the right upper quadrant under visualization, and there were no injuries to any abdominal organs. A 11 mm port was then placed in the umbilical region after infiltrating with local anesthesia under direct visualization. A second epigastric port was placed under direct visualization.   The gallbladder was identified and retracted, the peritoneum was then sharply dissected from the gallbladder and this dissection was carried down to Calot's triangle.  The cystic artery was dissected away from the surrounding tissues.   The critical angle was obtained.    The cystic artery was identified and 2 clips placed proximally and one distally and transected. The cystic duct was large and seen going into the gallbladder 360 degrees.  At this  time I went dome down to be sure to isolate the cystic duct.   3 endoloops were placed at the base and the cystic duct transected.  We then proceeded to remove the gallbladder off the hepatic fossa with Bovie cautery. A retrieval bag was then placed in the abdomen and gallbladder placed in the bag. The hepatic fossa was then reexamined and hemostasis was achieved with Bovie cautery and was excellent at this portion of the case. The subhepatic fossa and perihepatic fossa was then irrigated until the effluent was clear. The specimen bag and specimen were removed from the abdominal cavity.  The 11 mm trocar fascia was reapproximated with the Endo Close #1 Vicryl x2. The pneumoperitoneum was evacuated and all trochars removed under direct visulalization. The skin was then closed with 4-0 Monocryl and the skin dressed with Dermabond. The patient was awaken from general anesthesia and taken to the recovery room in stable condition.   PLAN OF CARE: Discharge to home after PACU  PATIENT DISPOSITION:  PACU - hemodynamically stable.   Delay start of Pharmacological VTE agent (>24hrs) due to surgical blood loss or risk of bleeding: no

## 2024-03-13 NOTE — TOC CM/SW Note (Signed)
 Transition of Care [facility]) - Inpatient Brief Assessment   Patient Details  Name: Danielle Burnett MRN: [id number] Date of Birth: Jan 15, 1971  Transition [facility]) CM/SW Contact:    Juliane Och, LCSW Phone Number: 03/13/2024, 9:16 AM   Clinical Narrative:  9:16 AM Per chart review, patient resides at home with spouse. Patient has a PCP and insurance. Patient does not have SNF history. Patient has HH history with [facility]. Patient has DME (crutches, rolling walker, knee scooter) history with [facility]. No TOC needs were identified at this time. TOC will continue to follow and be available to assist.  Transition of Care Asessment: Insurance and Status: Insurance coverage has been reviewed Patient has primary care physician: Yes Home environment has been reviewed: Private Residence Prior level of function:: N/A Prior/Current Home Services: No current home services (Has HH/DME history) Social Drivers of Health Review: SDOH reviewed no interventions necessary Readmission risk has been reviewed: Yes Transition of care needs: no transition of care needs at this time

## 2024-03-13 NOTE — Discharge Instructions (Signed)

## 2024-03-13 NOTE — Anesthesia Postprocedure Evaluation (Signed)
 Anesthesia Post Note  Patient: Danielle Burnett  Procedure(s) Performed: LAPAROSCOPIC CHOLECYSTECTOMY (Abdomen)     Patient location during evaluation: PACU Anesthesia Type: General Level of consciousness: awake and alert Pain management: pain level controlled Vital Signs Assessment: post-procedure vital signs reviewed and stable Respiratory status: spontaneous breathing, nonlabored ventilation, respiratory function stable and patient connected to nasal cannula oxygen Cardiovascular status: blood pressure returned to baseline and stable Postop Assessment: no apparent nausea or vomiting Anesthetic complications: no  No notable events documented.  Last Vitals:  Vitals:   03/13/24 0945 03/13/24 1018  BP: 124/79 135/74  Pulse: 63 68  Resp: 13 17  Temp: 36.4 C 36.5 C  SpO2: 97% 100%    Last Pain:  Vitals:   03/13/24 1046  TempSrc:   PainSc: 6                  Rosalita Combe

## 2024-03-13 NOTE — Progress Notes (Signed)
 Discharge instructions given. Patient verbalized understanding and all questions were answered.  ?

## 2024-03-14 ENCOUNTER — Encounter (HOSPITAL_COMMUNITY): Payer: Self-pay | Admitting: General Surgery

## 2024-03-14 LAB — SURGICAL PATHOLOGY

## 2024-03-15 NOTE — Discharge Summary (Addendum)
 [facility] Discharge Summary   Patient ID: Danielle Burnett MRN: [id number] DOB/AGE: 54/05/1971 52 y.o.  Admit date: 03/12/2024 Discharge date: 03/13/2024  Admitting Diagnosis: Acute cholecystitis   Discharge Diagnosis S/P laparoscopic cholecystectomy  Consultants None   Imaging: No results found.  Procedures Dr. Lynda Leos (03/13/24) - Laparoscopic Cholecystectomy   Hospital Course:  Patient is a 52 year old who presented to the ED with abdominal pain.  Workup showed acute cholecystitis.  Patient was admitted and underwent procedure listed above.  Tolerated procedure well and was transferred to the floor.  Diet was advanced as tolerated.  On POD0, the patient was voiding well, tolerating diet, ambulating well, pain well controlled, vital signs stable, incisions c/d/i and felt stable for discharge home.  Patient will follow up in our office in 3-4 weeks and knows to call with questions or concerns. She will call to confirm appointment date/time.    Physical Exam: General:  Alert, NAD, pleasant, comfortable Abd:  Soft, ND, mild tenderness, incisions C/D/I  I or a member of my team have reviewed this patient in the Controlled Substance Database.   Allergies as of 03/13/2024       Reactions   Cipro [ciprofloxacin Hcl] Rash   Flagyl [metronidazole] Rash        Medication List     TAKE these medications    acetaminophen  500 MG tablet Commonly known as: TYLENOL  Take 2 tablets (1,000 mg total) by mouth every 8 (eight) hours as needed for mild pain (pain score 1-3) or headache.   ezetimibe -simvastatin  10-20 MG tablet Commonly known as: VYTORIN  Take 1 tablet daily by mouth.   ibuprofen  200 MG tablet Commonly known as: Motrin  IB Take 2 tablets (400 mg total) by mouth every 6 (six) hours as needed for moderate pain (pain score 4-6).   levothyroxine  150 MCG tablet Commonly known as: SYNTHROID  Take 150 mcg by mouth daily before breakfast.   loratadine  10 MG  tablet Commonly known as: CLARITIN  Take 10 mg by mouth daily as needed for allergies. allergies   mometasone 50 MCG/ACT nasal spray Commonly known as: NASONEX Place 1 spray into the nose daily.   montelukast  10 MG tablet Commonly known as: SINGULAIR  Take 10 mg by mouth daily as needed (allergies).   olmesartan 40 MG tablet Commonly known as: BENICAR Take 40 mg by mouth daily.   traMADol  50 MG tablet Commonly known as: ULTRAM  Take 1 tablet (50 mg total) by mouth every 6 (six) hours as needed for moderate pain (pain score 4-6) or severe pain (pain score 7-10).          Follow-up Information     Maczis, Puja Gosai, PA-C. Go on 04/07/2024.   Specialty: General Surgery Why: 1:30 PM. Please plan to arrive 30 min prior to appointment time to check in. Contact information: [address] [place] [address] [facility] [place] [address] [phone]                 Signed: Burnard JONELLE Louder , [facility] 03/15/2024, 11:22 AM Please see Amion for pager number during day hours 7:00am-4:30pm

## 2024-03-30 DIAGNOSIS — I10 Essential (primary) hypertension: Secondary | ICD-10-CM | POA: Diagnosis not present

## 2024-05-03 ENCOUNTER — Other Ambulatory Visit (HOSPITAL_COMMUNITY): Payer: Self-pay

## 2024-05-03 MED ORDER — REPATHA 140 MG/ML ~~LOC~~ SOSY
140.0000 mg | PREFILLED_SYRINGE | SUBCUTANEOUS | 5 refills | Status: AC
  Filled 2024-05-03: qty 2, 28d supply, fill #0

## 2024-05-05 ENCOUNTER — Other Ambulatory Visit (HOSPITAL_COMMUNITY): Payer: Self-pay

## 2024-05-05 MED ORDER — TIRZEPATIDE-WEIGHT MANAGEMENT 2.5 MG/0.5ML ~~LOC~~ SOAJ
2.5000 mg | SUBCUTANEOUS | 0 refills | Status: AC
  Filled 2024-05-05: qty 2, 28d supply, fill #0

## 2024-06-20 ENCOUNTER — Other Ambulatory Visit (HOSPITAL_COMMUNITY): Payer: Self-pay

## 2024-06-23 ENCOUNTER — Other Ambulatory Visit (HOSPITAL_COMMUNITY): Payer: Self-pay

## 2024-06-23 MED ORDER — REPATHA SURECLICK 140 MG/ML ~~LOC~~ SOAJ
140.0000 mg | SUBCUTANEOUS | 3 refills | Status: AC
  Filled 2024-06-23 – 2024-10-18 (×3): qty 2, 28d supply, fill #0

## 2024-07-05 ENCOUNTER — Other Ambulatory Visit (HOSPITAL_COMMUNITY): Payer: Self-pay

## 2024-07-14 ENCOUNTER — Ambulatory Visit: Admitting: Plastic Surgery

## 2024-07-14 VITALS — BP 122/79 | HR 62 | Ht 62.0 in | Wt 215.5 lb

## 2024-07-14 DIAGNOSIS — R222 Localized swelling, mass and lump, trunk: Secondary | ICD-10-CM | POA: Diagnosis not present

## 2024-07-14 NOTE — Progress Notes (Signed)
 Patient ID: Danielle Burnett, female    DOB: 02-Apr-1971, 53 y.o.   MRN: [id number]   No chief complaint on file.   The patient is a 53 year old female here for evaluation of her back.  On the right scapular area she has a 3 x 3 cm mass.  It feels to be superficial and likely a lipoma.  She says that sometimes she feels a twinge or an tightness of her muscle that she thinks could be caused by the mass.  She had a skin lesion removed adjacent to the area in the past and that has healed well.  She is not on any blood thinners or immunosuppressive's and she is otherwise in good health.     Review of Systems  Constitutional: Negative.   Eyes: Negative.   Respiratory: Negative.    Cardiovascular: Negative.   Gastrointestinal: Negative.   Endocrine: Negative.   Genitourinary: Negative.   Musculoskeletal: Negative.     Past Medical History:  Diagnosis Date   Anemia    Hyperlipidemia    Hypertension    Hypothyroid     Past Surgical History:  Procedure Laterality Date   CESAREAN SECTION     CHOLECYSTECTOMY N/A 03/13/2024   Procedure: LAPAROSCOPIC CHOLECYSTECTOMY;  Surgeon: Rubin Calamity, MD;  Location: [facility] OR;  Service: General;  Laterality: N/A;   ENDOMETRIAL ABLATION     KIDNEY STONE SURGERY     ORIF ANKLE FRACTURE  10/15/2011   Procedure: OPEN REDUCTION INTERNAL FIXATION (ORIF) ANKLE FRACTURE;  Surgeon: Reyes JAYSON Billing;  Location: [facility] ORS;  Service: Orthopedics;  Laterality: Right;      Current Outpatient Medications:    acetaminophen  (TYLENOL ) 500 MG tablet, Take 2 tablets (1,000 mg total) by mouth every 8 (eight) hours as needed for mild pain (pain score 1-3) or headache., Disp: , Rfl:    Evolocumab  (REPATHA  SURECLICK) 140 MG/ML SOAJ, Inject 140 mg into the skin every 14 (fourteen) days., Disp: 6 mL, Rfl: 3   Evolocumab  (REPATHA ) 140 MG/ML SOSY, Inject 140 mg into the skin every 14 (fourteen) days., Disp: 2 mL, Rfl: 5   ezetimibe -simvastatin  (VYTORIN ) 10-20 MG tablet, Take 1  tablet daily by mouth., Disp: , Rfl: 2   ibuprofen  (MOTRIN  IB) 200 MG tablet, Take 2 tablets (400 mg total) by mouth every 6 (six) hours as needed for moderate pain (pain score 4-6)., Disp: , Rfl:    levothyroxine  (SYNTHROID ) 150 MCG tablet, Take 150 mcg by mouth daily before breakfast., Disp: , Rfl:    loratadine  (CLARITIN ) 10 MG tablet, Take 10 mg by mouth daily as needed for allergies. allergies, Disp: , Rfl:    mometasone (NASONEX) 50 MCG/ACT nasal spray, Place 1 spray into the nose daily., Disp: , Rfl:    montelukast  (SINGULAIR ) 10 MG tablet, Take 10 mg by mouth daily as needed (allergies)., Disp: , Rfl: 3   olmesartan (BENICAR) 40 MG tablet, Take 40 mg by mouth daily., Disp: , Rfl:    tirzepatide  (ZEPBOUND ) 2.5 MG/0.5ML Pen, Inject 2.5 mg into the skin once a week., Disp: 2 mL, Rfl: 0   traMADol  (ULTRAM ) 50 MG tablet, Take 1 tablet (50 mg total) by mouth every 6 (six) hours as needed for moderate pain (pain score 4-6) or severe pain (pain score 7-10)., Disp: 10 tablet, Rfl: 0   Objective:   Vitals:   07/14/24 1034  BP: 122/79  Pulse: 62  SpO2: 99%    Physical Exam Vitals reviewed.  Constitutional:  Appearance: Normal appearance.  HENT:     Head: Atraumatic.  Cardiovascular:     Rate and Rhythm: Normal rate.     Pulses: Normal pulses.  Pulmonary:     Effort: Pulmonary effort is normal.  Musculoskeletal:       Arms:  Skin:    General: Skin is warm.     Capillary Refill: Capillary refill takes less than 2 seconds.     Coloration: Skin is not jaundiced.     Findings: Lesion present. No bruising.  Neurological:     Mental Status: She is alert and oriented to person, place, and time.  Psychiatric:        Mood and Affect: Mood normal.        Behavior: Behavior normal.        Thought Content: Thought content normal.        Judgment: Judgment normal.     Assessment & Plan:  Mass on back  Plan for excision of back mass in OR.  Due to the location I think it would be  better to do it in the operating room.  Patient is in agreement.  Pictures were obtained of the patient and placed in the chart with the patient's or guardian's permission.   Estefana RAMAN Arihant Pennings, DO

## 2024-08-02 ENCOUNTER — Ambulatory Visit: Admitting: Student

## 2024-08-02 VITALS — BP 127/63 | HR 69

## 2024-08-02 DIAGNOSIS — R222 Localized swelling, mass and lump, trunk: Secondary | ICD-10-CM | POA: Diagnosis not present

## 2024-08-02 MED ORDER — TRAMADOL HCL 50 MG PO TABS: 50.0000 mg | ORAL_TABLET | Freq: Three times a day (TID) | ORAL | 0 refills | Status: AC | PRN

## 2024-08-02 MED ORDER — ONDANSETRON HCL 4 MG PO TABS: 4.0000 mg | ORAL_TABLET | Freq: Three times a day (TID) | ORAL | 0 refills | Status: AC | PRN

## 2024-08-02 MED ORDER — CEPHALEXIN 500 MG PO CAPS: 500.0000 mg | ORAL_CAPSULE | Freq: Four times a day (QID) | ORAL | 0 refills | Status: AC

## 2024-08-02 NOTE — H&P (View-Only) (Signed)
 Patient ID: Danielle Burnett, female    DOB: 04-15-1971, 53 y.o.   MRN: [id number]  Preoperative appointment     ICD-10-CM   1. Mass on back  R22.2        History of Present Illness: Danielle Burnett is a 53 y.o.  female  with a history of mass to the back.  She presents for preoperative evaluation for upcoming procedure, excision of back mass, scheduled for 08/10/2024 with Dr. Lowery.  The patient has not had problems with anesthesia.  Patient reports she had gallbladder surgery back in June and denies any issues with anesthesia from then and denies any issues from the surgery itself.  Patient denies any history of cardiac disease.  She states that she is not taking any blood thinners.  Patient reports she is not a smoker.  Patient denies taking any birth control or hormone replacement.  She denies any history of miscarriages.  She denies any personal history of blood clots.  She states that her mother and her grandmother have history of strokes and TIAs.  She denies any personal or family history of clotting diseases.  She denies any recent surgeries, traumas or infections in the past month.  She denies any history of stroke or heart attack.  She denies any history of Crohn's disease or ulcerative colitis.  She denies any history of COPD.  She does report she has asthma, which is well-controlled.  She denies any history of cancer.  She denies any varicosities to her lower extremities.  She denies any recent fevers, chills or changes in her health.  Summary of Previous Visit: Patient was seen for initial consult by Dr. Lowery on 07/14/2024.  At this visit, it was noted patient had a 3 x 3 cm mass to the right scapular area.  It felt to be superficial and likely a lipoma.  Patient reported that sometimes she felt a twinge in her tightness of her muscle that she thinks could be caused by the mass.  Plan was for excision of the back mass in the operating room.  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  She understands that she will have lifting restrictions for 4 to 6 weeks.  PMH Significant for: BPPV, cholelithiasis, hypertension   Past Medical History: Allergies: Allergies  Allergen Reactions   Cipro [Ciprofloxacin Hcl] Rash   Flagyl [Metronidazole] Rash    Current Medications:  Current Outpatient Medications:    cephALEXin (KEFLEX) 500 MG capsule, Take 1 capsule (500 mg total) by mouth 4 (four) times daily for 3 days., Disp: 12 capsule, Rfl: 0   ondansetron  (ZOFRAN ) 4 MG tablet, Take 1 tablet (4 mg total) by mouth every 8 (eight) hours as needed for up to 10 doses for nausea or vomiting., Disp: 10 tablet, Rfl: 0   traMADol  (ULTRAM ) 50 MG tablet, Take 1 tablet (50 mg total) by mouth every 8 (eight) hours as needed for up to 8 doses for moderate pain (pain score 4-6) or severe pain (pain score 7-10)., Disp: 8 tablet, Rfl: 0   acetaminophen  (TYLENOL ) 500 MG tablet, Take 2 tablets (1,000 mg total) by mouth every 8 (eight) hours as needed for mild pain (pain score 1-3) or headache., Disp: , Rfl:    Evolocumab  (REPATHA  SURECLICK) 140 MG/ML SOAJ, Inject 140 mg into the skin every 14 (fourteen) days., Disp: 6 mL, Rfl: 3  Evolocumab  (REPATHA ) 140 MG/ML SOSY, Inject 140 mg into the skin every 14 (fourteen) days., Disp: 2 mL, Rfl: 5   ezetimibe -simvastatin  (VYTORIN ) 10-20 MG tablet, Take 1 tablet daily by mouth., Disp: , Rfl: 2   ibuprofen  (MOTRIN  IB) 200 MG tablet, Take 2 tablets (400 mg total) by mouth every 6 (six) hours as needed for moderate pain (pain score 4-6)., Disp: , Rfl:    levothyroxine  (SYNTHROID ) 150 MCG tablet, Take 150 mcg by mouth daily before breakfast., Disp: , Rfl:    loratadine  (CLARITIN ) 10 MG tablet, Take 10 mg by mouth daily as needed for allergies. allergies, Disp: , Rfl:    mometasone (NASONEX) 50 MCG/ACT nasal spray, Place 1 spray into the nose daily., Disp: , Rfl:     montelukast  (SINGULAIR ) 10 MG tablet, Take 10 mg by mouth daily as needed (allergies)., Disp: , Rfl: 3   olmesartan (BENICAR) 40 MG tablet, Take 40 mg by mouth daily., Disp: , Rfl:    tirzepatide  (ZEPBOUND ) 2.5 MG/0.5ML Pen, Inject 2.5 mg into the skin once a week., Disp: 2 mL, Rfl: 0  Past Medical Problems: Past Medical History:  Diagnosis Date   Anemia    Hyperlipidemia    Hypertension    Hypothyroid     Past Surgical History: Past Surgical History:  Procedure Laterality Date   CESAREAN SECTION     CHOLECYSTECTOMY N/A 03/13/2024   Procedure: LAPAROSCOPIC CHOLECYSTECTOMY;  Surgeon: Rubin Calamity, MD;  Location: [facility] OR;  Service: General;  Laterality: N/A;   ENDOMETRIAL ABLATION     KIDNEY STONE SURGERY     ORIF ANKLE FRACTURE  10/15/2011   Procedure: OPEN REDUCTION INTERNAL FIXATION (ORIF) ANKLE FRACTURE;  Surgeon: Reyes JAYSON Billing;  Location: [facility] ORS;  Service: Orthopedics;  Laterality: Right;    Social History: Social History   Socioeconomic History   Marital status: Married    Spouse name: Not on file   Number of children: 2   Years of education: Not on file   Highest education level: Not on file  Occupational History   Occupation: sales    Comment: [employer]   Tobacco Use   Smoking status: Never   Smokeless tobacco: Never  Vaping Use   Vaping status: Never Used  Substance and Sexual Activity   Alcohol  use: No   Drug use: No   Sexual activity: Not on file  Other Topics Concern   Not on file  Social History Narrative   Married, 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   2 daughters birth years 2003 and 2012   Social Drivers of Health   Financial Resource Strain: Not on file  Food Insecurity: No Food Insecurity (03/12/2024)   Hunger Vital Sign    Worried About Running Out of Food in the Last Year: Never true    Ran Out of Food in the Last Year: Never true  Transportation Needs: No Transportation Needs (03/12/2024)   PRAPARE - Administrator, Civil Service (Medical):  No    Lack of Transportation (Non-Medical): No  Physical Activity: Not on file  Stress: Not on file  Social Connections: Not on file  Intimate Partner Violence: Not At Risk (03/12/2024)   Humiliation, Afraid, Rape, and Kick questionnaire    Fear of Current or Ex-Partner: No    Emotionally Abused: No    Physically Abused: No    Sexually Abused: No    Family History: Family History  Problem Relation Age of Onset   Heart disease Father    Colon  cancer Paternal Grandfather     Review of Systems: Denies any recent fevers, chills or changes in her health  Physical Exam: Vital Signs BP 127/63   Pulse 69   SpO2 99%   Physical Exam  Constitutional:      General: Not in acute distress.    Appearance: Normal appearance. Not ill-appearing.  HENT:     Head: Normocephalic and atraumatic.  Neck:     Musculoskeletal: Normal range of motion.  Cardiovascular:     Rate and Rhythm: Normal rate Pulmonary:     Effort: Pulmonary effort is normal. No respiratory distress.  Musculoskeletal: Normal range of motion.  Skin:    General: Skin is warm and dry.     Findings: No erythema or rash.  Neurological:     Mental Status: Alert and oriented to person, place, and time. Mental status is at baseline.  Psychiatric:        Mood and Affect: Mood normal.        Behavior: Behavior normal.    Assessment/Plan: The patient is scheduled for excision of back mass with Dr. Lowery.  Risks, benefits, and alternatives of procedure discussed, questions answered and consent obtained.    Smoking Status: Non-smoker; Counseling Given?  N/A  Caprini Score: 7; Risk Factors include: Age, BMI greater than 25, family history of stroke/TIA and length of planned surgery. Recommendation for mechanical and possible pharmacological prophylaxis. Encourage early ambulation.  Will discuss possibility of postoperative Lovenox  with Dr. Lowery.  Pictures obtained: @consult   Post-op Rx sent to pharmacy: Tramadol ,  Keflex, Zofran -patient states that she took tramadol  for her gallbladder surgery and reports that she did well with it.  Discussed with the patient to hold ibuprofen  at least 1 week prior to surgery.  Discussed with her to hold her Vytorin  the day before and the day of surgery.  Discussed with patient to hold her Zepbound  at least the dose before surgery.  Instructed her to hold any multivitamins or supplements the week before surgery.  Patient expressed understanding.  Patient was provided with the General Surgical Risk consent document and Pain Medication Agreement prior to their appointment.  They had adequate time to read through the risk consent documents and Pain Medication Agreement. We also discussed them in person together during this preop appointment. All of their questions were answered to their satisfaction.  Recommended calling if they have any further questions.  Risk consent form and Pain Medication Agreement to be scanned into patient's chart.  The consent was obtained with risks and complications reviewed which included bleeding, pain, scar, infection and the risk of anesthesia.  The patients questions were answered to the patients expressed satisfaction.    Electronically signed by: Estefana FORBES Peck, PA-C 08/02/2024 12:00 PM

## 2024-08-02 NOTE — Progress Notes (Signed)
 Patient ID: Danielle Burnett, female    DOB: 04-15-1971, 53 y.o.   MRN: [id number]  Preoperative appointment     ICD-10-CM   1. Mass on back  R22.2        History of Present Illness: Danielle Burnett is a 53 y.o.  female  with a history of mass to the back.  She presents for preoperative evaluation for upcoming procedure, excision of back mass, scheduled for 08/10/2024 with Dr. Lowery.  The patient has not had problems with anesthesia.  Patient reports she had gallbladder surgery back in June and denies any issues with anesthesia from then and denies any issues from the surgery itself.  Patient denies any history of cardiac disease.  She states that she is not taking any blood thinners.  Patient reports she is not a smoker.  Patient denies taking any birth control or hormone replacement.  She denies any history of miscarriages.  She denies any personal history of blood clots.  She states that her mother and her grandmother have history of strokes and TIAs.  She denies any personal or family history of clotting diseases.  She denies any recent surgeries, traumas or infections in the past month.  She denies any history of stroke or heart attack.  She denies any history of Crohn's disease or ulcerative colitis.  She denies any history of COPD.  She does report she has asthma, which is well-controlled.  She denies any history of cancer.  She denies any varicosities to her lower extremities.  She denies any recent fevers, chills or changes in her health.  Summary of Previous Visit: Patient was seen for initial consult by Dr. Lowery on 07/14/2024.  At this visit, it was noted patient had a 3 x 3 cm mass to the right scapular area.  It felt to be superficial and likely a lipoma.  Patient reported that sometimes she felt a twinge in her tightness of her muscle that she thinks could be caused by the mass.  Plan was for excision of the back mass in the operating room.  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  She understands that she will have lifting restrictions for 4 to 6 weeks.  PMH Significant for: BPPV, cholelithiasis, hypertension   Past Medical History: Allergies: Allergies  Allergen Reactions   Cipro [Ciprofloxacin Hcl] Rash   Flagyl [Metronidazole] Rash    Current Medications:  Current Outpatient Medications:    cephALEXin (KEFLEX) 500 MG capsule, Take 1 capsule (500 mg total) by mouth 4 (four) times daily for 3 days., Disp: 12 capsule, Rfl: 0   ondansetron  (ZOFRAN ) 4 MG tablet, Take 1 tablet (4 mg total) by mouth every 8 (eight) hours as needed for up to 10 doses for nausea or vomiting., Disp: 10 tablet, Rfl: 0   traMADol  (ULTRAM ) 50 MG tablet, Take 1 tablet (50 mg total) by mouth every 8 (eight) hours as needed for up to 8 doses for moderate pain (pain score 4-6) or severe pain (pain score 7-10)., Disp: 8 tablet, Rfl: 0   acetaminophen  (TYLENOL ) 500 MG tablet, Take 2 tablets (1,000 mg total) by mouth every 8 (eight) hours as needed for mild pain (pain score 1-3) or headache., Disp: , Rfl:    Evolocumab  (REPATHA  SURECLICK) 140 MG/ML SOAJ, Inject 140 mg into the skin every 14 (fourteen) days., Disp: 6 mL, Rfl: 3  Evolocumab  (REPATHA ) 140 MG/ML SOSY, Inject 140 mg into the skin every 14 (fourteen) days., Disp: 2 mL, Rfl: 5   ezetimibe -simvastatin  (VYTORIN ) 10-20 MG tablet, Take 1 tablet daily by mouth., Disp: , Rfl: 2   ibuprofen  (MOTRIN  IB) 200 MG tablet, Take 2 tablets (400 mg total) by mouth every 6 (six) hours as needed for moderate pain (pain score 4-6)., Disp: , Rfl:    levothyroxine  (SYNTHROID ) 150 MCG tablet, Take 150 mcg by mouth daily before breakfast., Disp: , Rfl:    loratadine  (CLARITIN ) 10 MG tablet, Take 10 mg by mouth daily as needed for allergies. allergies, Disp: , Rfl:    mometasone (NASONEX) 50 MCG/ACT nasal spray, Place 1 spray into the nose daily., Disp: , Rfl:     montelukast  (SINGULAIR ) 10 MG tablet, Take 10 mg by mouth daily as needed (allergies)., Disp: , Rfl: 3   olmesartan (BENICAR) 40 MG tablet, Take 40 mg by mouth daily., Disp: , Rfl:    tirzepatide  (ZEPBOUND ) 2.5 MG/0.5ML Pen, Inject 2.5 mg into the skin once a week., Disp: 2 mL, Rfl: 0  Past Medical Problems: Past Medical History:  Diagnosis Date   Anemia    Hyperlipidemia    Hypertension    Hypothyroid     Past Surgical History: Past Surgical History:  Procedure Laterality Date   CESAREAN SECTION     CHOLECYSTECTOMY N/A 03/13/2024   Procedure: LAPAROSCOPIC CHOLECYSTECTOMY;  Surgeon: Rubin Calamity, MD;  Location: [facility] OR;  Service: General;  Laterality: N/A;   ENDOMETRIAL ABLATION     KIDNEY STONE SURGERY     ORIF ANKLE FRACTURE  10/15/2011   Procedure: OPEN REDUCTION INTERNAL FIXATION (ORIF) ANKLE FRACTURE;  Surgeon: Reyes JAYSON Billing;  Location: [facility] ORS;  Service: Orthopedics;  Laterality: Right;    Social History: Social History   Socioeconomic History   Marital status: Married    Spouse name: Not on file   Number of children: 2   Years of education: Not on file   Highest education level: Not on file  Occupational History   Occupation: sales    Comment: [employer]   Tobacco Use   Smoking status: Never   Smokeless tobacco: Never  Vaping Use   Vaping status: Never Used  Substance and Sexual Activity   Alcohol  use: No   Drug use: No   Sexual activity: Not on file  Other Topics Concern   Not on file  Social History Narrative   Married, 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   2 daughters birth years 2003 and 2012   Social Drivers of Health   Financial Resource Strain: Not on file  Food Insecurity: No Food Insecurity (03/12/2024)   Hunger Vital Sign    Worried About Running Out of Food in the Last Year: Never true    Ran Out of Food in the Last Year: Never true  Transportation Needs: No Transportation Needs (03/12/2024)   PRAPARE - Administrator, Civil Service (Medical):  No    Lack of Transportation (Non-Medical): No  Physical Activity: Not on file  Stress: Not on file  Social Connections: Not on file  Intimate Partner Violence: Not At Risk (03/12/2024)   Humiliation, Afraid, Rape, and Kick questionnaire    Fear of Current or Ex-Partner: No    Emotionally Abused: No    Physically Abused: No    Sexually Abused: No    Family History: Family History  Problem Relation Age of Onset   Heart disease Father    Colon  cancer Paternal Grandfather     Review of Systems: Denies any recent fevers, chills or changes in her health  Physical Exam: Vital Signs BP 127/63   Pulse 69   SpO2 99%   Physical Exam  Constitutional:      General: Not in acute distress.    Appearance: Normal appearance. Not ill-appearing.  HENT:     Head: Normocephalic and atraumatic.  Neck:     Musculoskeletal: Normal range of motion.  Cardiovascular:     Rate and Rhythm: Normal rate Pulmonary:     Effort: Pulmonary effort is normal. No respiratory distress.  Musculoskeletal: Normal range of motion.  Skin:    General: Skin is warm and dry.     Findings: No erythema or rash.  Neurological:     Mental Status: Alert and oriented to person, place, and time. Mental status is at baseline.  Psychiatric:        Mood and Affect: Mood normal.        Behavior: Behavior normal.    Assessment/Plan: The patient is scheduled for excision of back mass with Dr. Lowery.  Risks, benefits, and alternatives of procedure discussed, questions answered and consent obtained.    Smoking Status: Non-smoker; Counseling Given?  N/A  Caprini Score: 7; Risk Factors include: Age, BMI greater than 25, family history of stroke/TIA and length of planned surgery. Recommendation for mechanical and possible pharmacological prophylaxis. Encourage early ambulation.  Will discuss possibility of postoperative Lovenox  with Dr. Lowery.  Pictures obtained: @consult   Post-op Rx sent to pharmacy: Tramadol ,  Keflex, Zofran -patient states that she took tramadol  for her gallbladder surgery and reports that she did well with it.  Discussed with the patient to hold ibuprofen  at least 1 week prior to surgery.  Discussed with her to hold her Vytorin  the day before and the day of surgery.  Discussed with patient to hold her Zepbound  at least the dose before surgery.  Instructed her to hold any multivitamins or supplements the week before surgery.  Patient expressed understanding.  Patient was provided with the General Surgical Risk consent document and Pain Medication Agreement prior to their appointment.  They had adequate time to read through the risk consent documents and Pain Medication Agreement. We also discussed them in person together during this preop appointment. All of their questions were answered to their satisfaction.  Recommended calling if they have any further questions.  Risk consent form and Pain Medication Agreement to be scanned into patient's chart.  The consent was obtained with risks and complications reviewed which included bleeding, pain, scar, infection and the risk of anesthesia.  The patients questions were answered to the patients expressed satisfaction.    Electronically signed by: Estefana FORBES Peck, PA-C 08/02/2024 12:00 PM

## 2024-08-03 ENCOUNTER — Encounter (HOSPITAL_BASED_OUTPATIENT_CLINIC_OR_DEPARTMENT_OTHER): Payer: Self-pay | Admitting: Plastic Surgery

## 2024-08-03 ENCOUNTER — Other Ambulatory Visit: Payer: Self-pay

## 2024-08-10 ENCOUNTER — Ambulatory Visit (HOSPITAL_BASED_OUTPATIENT_CLINIC_OR_DEPARTMENT_OTHER)
Admission: RE | Admit: 2024-08-10 | Discharge: 2024-08-10 | Disposition: A | Discharge status: Home / Self Care | Attending: Plastic Surgery | Admitting: Plastic Surgery

## 2024-08-10 ENCOUNTER — Encounter (HOSPITAL_BASED_OUTPATIENT_CLINIC_OR_DEPARTMENT_OTHER): Admission: RE | Disposition: A | Payer: Self-pay | Source: Home / Self Care | Attending: Plastic Surgery

## 2024-08-10 ENCOUNTER — Ambulatory Visit (HOSPITAL_BASED_OUTPATIENT_CLINIC_OR_DEPARTMENT_OTHER): Admitting: Anesthesiology

## 2024-08-10 ENCOUNTER — Encounter (HOSPITAL_BASED_OUTPATIENT_CLINIC_OR_DEPARTMENT_OTHER): Payer: Self-pay | Admitting: Plastic Surgery

## 2024-08-10 DIAGNOSIS — I10 Essential (primary) hypertension: Secondary | ICD-10-CM | POA: Insufficient documentation

## 2024-08-10 DIAGNOSIS — E039 Hypothyroidism, unspecified: Secondary | ICD-10-CM | POA: Diagnosis not present

## 2024-08-10 DIAGNOSIS — J45909 Unspecified asthma, uncomplicated: Secondary | ICD-10-CM | POA: Diagnosis not present

## 2024-08-10 DIAGNOSIS — D171 Benign lipomatous neoplasm of skin and subcutaneous tissue of trunk: Secondary | ICD-10-CM | POA: Diagnosis not present

## 2024-08-10 DIAGNOSIS — Z01818 Encounter for other preprocedural examination: Secondary | ICD-10-CM

## 2024-08-10 DIAGNOSIS — Z79899 Other long term (current) drug therapy: Secondary | ICD-10-CM | POA: Diagnosis not present

## 2024-08-10 DIAGNOSIS — R222 Localized swelling, mass and lump, trunk: Secondary | ICD-10-CM | POA: Diagnosis not present

## 2024-08-10 DIAGNOSIS — Z79890 Hormone replacement therapy (postmenopausal): Secondary | ICD-10-CM | POA: Diagnosis not present

## 2024-08-10 HISTORY — PX: EXCISION MASS, BACK: SHX7560

## 2024-08-10 SURGERY — EXCISION MASS, BACK
Anesthesia: General | Site: Back | Laterality: Right

## 2024-08-10 MED ORDER — LACTATED RINGERS IV SOLN: INTRAVENOUS | Status: DC

## 2024-08-10 MED ORDER — ROCURONIUM BROMIDE 10 MG/ML (PF) SYRINGE
PREFILLED_SYRINGE | INTRAVENOUS | Status: AC
  Filled 2024-08-10: qty 10

## 2024-08-10 MED ORDER — PROPOFOL 10 MG/ML IV BOLUS
INTRAVENOUS | Status: DC | PRN
Start: 2024-08-10 — End: 2024-08-10
  Administered 2024-08-10: 200 mg via INTRAVENOUS

## 2024-08-10 MED ORDER — PHENYLEPHRINE HCL (PRESSORS) 10 MG/ML IV SOLN
INTRAVENOUS | Status: DC | PRN
  Administered 2024-08-10: 160 ug via INTRAVENOUS
  Administered 2024-08-10: 80 ug via INTRAVENOUS

## 2024-08-10 MED ORDER — ONDANSETRON HCL 4 MG/2ML IJ SOLN
INTRAMUSCULAR | Status: DC | PRN
  Administered 2024-08-10: 4 mg via INTRAVENOUS

## 2024-08-10 MED ORDER — FENTANYL CITRATE (PF) 100 MCG/2ML IJ SOLN
INTRAMUSCULAR | Status: AC
  Filled 2024-08-10: qty 2

## 2024-08-10 MED ORDER — ATROPINE SULFATE 0.4 MG/ML IV SOLN
INTRAVENOUS | Status: AC
  Filled 2024-08-10: qty 1

## 2024-08-10 MED ORDER — 0.9 % SODIUM CHLORIDE (POUR BTL) OPTIME
TOPICAL | Status: DC | PRN
  Administered 2024-08-10: 1000 mL

## 2024-08-10 MED ORDER — FENTANYL CITRATE (PF) 100 MCG/2ML IJ SOLN: 25.0000 ug | INTRAMUSCULAR | Status: DC | PRN

## 2024-08-10 MED ORDER — MIDAZOLAM HCL 2 MG/2ML IJ SOLN
INTRAMUSCULAR | Status: AC
  Filled 2024-08-10: qty 2

## 2024-08-10 MED ORDER — PROPOFOL 500 MG/50ML IV EMUL
INTRAVENOUS | Status: DC | PRN
  Administered 2024-08-10: 150 ug/kg/min via INTRAVENOUS

## 2024-08-10 MED ORDER — SODIUM CHLORIDE 0.9% FLUSH: 3.0000 mL | INTRAVENOUS | Status: DC | PRN

## 2024-08-10 MED ORDER — SODIUM CHLORIDE 0.9% FLUSH: 3.0000 mL | Freq: Two times a day (BID) | INTRAVENOUS | Status: DC

## 2024-08-10 MED ORDER — OXYCODONE HCL 5 MG PO TABS: 5.0000 mg | ORAL_TABLET | Freq: Once | ORAL | Status: DC | PRN

## 2024-08-10 MED ORDER — BUPIVACAINE LIPOSOME 1.3 % IJ SUSP
INTRAMUSCULAR | Status: DC | PRN
  Administered 2024-08-10: 10 mL

## 2024-08-10 MED ORDER — LIDOCAINE HCL (CARDIAC) PF 100 MG/5ML IV SOSY
PREFILLED_SYRINGE | INTRAVENOUS | Status: DC | PRN
  Administered 2024-08-10: 40 mg via INTRAVENOUS

## 2024-08-10 MED ORDER — EPHEDRINE 5 MG/ML INJ
INTRAVENOUS | Status: AC
  Filled 2024-08-10: qty 5

## 2024-08-10 MED ORDER — ROCURONIUM BROMIDE 100 MG/10ML IV SOLN
INTRAVENOUS | Status: DC | PRN
  Administered 2024-08-10: 60 mg via INTRAVENOUS

## 2024-08-10 MED ORDER — OXYCODONE HCL 5 MG/5ML PO SOLN: 5.0000 mg | Freq: Once | ORAL | Status: DC | PRN

## 2024-08-10 MED ORDER — ONDANSETRON HCL 4 MG/2ML IJ SOLN
INTRAMUSCULAR | Status: AC
  Filled 2024-08-10: qty 2

## 2024-08-10 MED ORDER — CHLORHEXIDINE GLUCONATE CLOTH 2 % EX PADS: 6.0000 | MEDICATED_PAD | Freq: Once | CUTANEOUS | Status: DC

## 2024-08-10 MED ORDER — SUCCINYLCHOLINE CHLORIDE 200 MG/10ML IV SOSY
PREFILLED_SYRINGE | INTRAVENOUS | Status: AC
  Filled 2024-08-10: qty 10

## 2024-08-10 MED ORDER — LIDOCAINE-EPINEPHRINE 1 %-1:100000 IJ SOLN
INTRAMUSCULAR | Status: DC | PRN
  Administered 2024-08-10: 7 mL

## 2024-08-10 MED ORDER — AMISULPRIDE (ANTIEMETIC) 5 MG/2ML IV SOLN: 10.0000 mg | Freq: Once | INTRAVENOUS | Status: DC | PRN

## 2024-08-10 MED ORDER — SODIUM CHLORIDE 0.9 % IV SOLN: 250.0000 mL | INTRAVENOUS | Status: DC | PRN

## 2024-08-10 MED ORDER — DEXAMETHASONE SODIUM PHOSPHATE 4 MG/ML IJ SOLN
INTRAMUSCULAR | Status: DC | PRN
  Administered 2024-08-10: 5 mg via INTRAVENOUS

## 2024-08-10 MED ORDER — ACETAMINOPHEN 325 MG PO TABS: 650.0000 mg | ORAL_TABLET | ORAL | Status: DC | PRN

## 2024-08-10 MED ORDER — ACETAMINOPHEN 10 MG/ML IV SOLN
INTRAVENOUS | Status: AC
  Filled 2024-08-10: qty 100

## 2024-08-10 MED ORDER — ACETAMINOPHEN 325 MG RE SUPP: 650.0000 mg | RECTAL | Status: DC | PRN

## 2024-08-10 MED ORDER — BUPIVACAINE LIPOSOME 1.3 % IJ SUSP
INTRAMUSCULAR | Status: AC
  Filled 2024-08-10: qty 10

## 2024-08-10 MED ORDER — CEFAZOLIN SODIUM-DEXTROSE 2-4 GM/100ML-% IV SOLN
INTRAVENOUS | Status: AC
  Filled 2024-08-10: qty 100

## 2024-08-10 MED ORDER — FENTANYL CITRATE (PF) 100 MCG/2ML IJ SOLN
INTRAMUSCULAR | Status: DC | PRN
  Administered 2024-08-10 (×2): 50 ug via INTRAVENOUS

## 2024-08-10 MED ORDER — ACETAMINOPHEN 10 MG/ML IV SOLN
INTRAVENOUS | Status: DC | PRN
Start: 2024-08-10 — End: 2024-08-10
  Administered 2024-08-10: 1000 mg via INTRAVENOUS

## 2024-08-10 MED ORDER — CEFAZOLIN SODIUM-DEXTROSE 2-4 GM/100ML-% IV SOLN
2.0000 g | INTRAVENOUS | Status: AC
  Administered 2024-08-10: 2 g via INTRAVENOUS

## 2024-08-10 MED ORDER — OXYCODONE HCL 5 MG PO TABS: 5.0000 mg | ORAL_TABLET | ORAL | Status: DC | PRN

## 2024-08-10 MED ORDER — PHENYLEPHRINE 80 MCG/ML (10ML) SYRINGE FOR IV PUSH (FOR BLOOD PRESSURE SUPPORT)
PREFILLED_SYRINGE | INTRAVENOUS | Status: AC
  Filled 2024-08-10: qty 10

## 2024-08-10 MED ORDER — LIDOCAINE 2% (20 MG/ML) 5 ML SYRINGE
INTRAMUSCULAR | Status: AC
  Filled 2024-08-10: qty 5

## 2024-08-10 MED ORDER — SUGAMMADEX SODIUM 200 MG/2ML IV SOLN
INTRAVENOUS | Status: DC | PRN
  Administered 2024-08-10: 400 mg via INTRAVENOUS

## 2024-08-10 MED ORDER — ACETAMINOPHEN 500 MG PO TABS
ORAL_TABLET | ORAL | Status: AC
Start: 2024-08-10 — End: 2024-08-10
  Filled 2024-08-10: qty 2

## 2024-08-10 MED ORDER — MIDAZOLAM HCL 5 MG/5ML IJ SOLN
INTRAMUSCULAR | Status: DC | PRN
  Administered 2024-08-10: 2 mg via INTRAVENOUS

## 2024-08-10 SURGICAL SUPPLY — 64 items
BAND RUBBER #18 3X1/16 STRL (MISCELLANEOUS) IMPLANT
BENZOIN TINCTURE PRP APPL 2/3 (GAUZE/BANDAGES/DRESSINGS) IMPLANT
BINDER BREAST XLRG (GAUZE/BANDAGES/DRESSINGS) IMPLANT
BLADE CLIPPER SURG (BLADE) IMPLANT
BLADE SURG 15 STRL LF DISP TIS (BLADE) ×1 IMPLANT
BNDG ELASTIC 2INX 5YD STR LF (GAUZE/BANDAGES/DRESSINGS) IMPLANT
CANISTER SUCT 1200ML W/VALVE (MISCELLANEOUS) IMPLANT
CLEANER CAUTERY TIP PAD (MISCELLANEOUS) IMPLANT
CORD BIPOLAR FORCEPS 12FT (ELECTRODE) IMPLANT
COVER BACK TABLE 60X90IN (DRAPES) ×1 IMPLANT
COVER MAYO STAND STRL (DRAPES) ×1 IMPLANT
DERMABOND ADVANCED .7 DNX12 (GAUZE/BANDAGES/DRESSINGS) IMPLANT
DRAPE LAPAROTOMY 100X72 PEDS (DRAPES) IMPLANT
DRAPE U-SHAPE 76X120 STRL (DRAPES) ×1 IMPLANT
DRESSING MEPILEX FLEX 4X4 (GAUZE/BANDAGES/DRESSINGS) IMPLANT
DRSG TEGADERM 2-3/8X2-3/4 SM (GAUZE/BANDAGES/DRESSINGS) IMPLANT
DRSG TEGADERM 4X4.75 (GAUZE/BANDAGES/DRESSINGS) IMPLANT
ELECT COATED BLADE 2.86 ST (ELECTRODE) IMPLANT
ELECT NDL BLADE 2-5/6 (NEEDLE) IMPLANT
ELECT NEEDLE BLADE 2-5/6 (NEEDLE) IMPLANT
ELECTRODE BLDE 4.0 EZ CLN MEGD (MISCELLANEOUS) IMPLANT
ELECTRODE REM PT RETRN 9FT PED (ELECTROSURGICAL) IMPLANT
ELECTRODE REM PT RTRN 9FT ADLT (ELECTROSURGICAL) IMPLANT
GAUZE SPONGE 2X2 STRL 8-PLY (GAUZE/BANDAGES/DRESSINGS) IMPLANT
GAUZE SPONGE 4X4 12PLY STRL LF (GAUZE/BANDAGES/DRESSINGS) IMPLANT
GAUZE STRETCH 2X75IN STRL (MISCELLANEOUS) IMPLANT
GLOVE BIO SURGEON STRL SZ 6.5 (GLOVE) ×2 IMPLANT
GLOVE BIOGEL M STRL SZ7.5 (GLOVE) ×1 IMPLANT
GOWN STRL REUS W/ TWL LRG LVL3 (GOWN DISPOSABLE) ×2 IMPLANT
GOWN STRL REUS W/ TWL XL LVL3 (GOWN DISPOSABLE) ×1 IMPLANT
NDL HYPO 25X1 1.5 SAFETY (NEEDLE) IMPLANT
NDL HYPO 30GX1 BEV (NEEDLE) IMPLANT
NDL PRECISIONGLIDE 27X1.5 (NEEDLE) ×1 IMPLANT
NEEDLE HYPO 25X1 1.5 SAFETY (NEEDLE) ×2 IMPLANT
NEEDLE HYPO 30GX1 BEV (NEEDLE) IMPLANT
NEEDLE PRECISIONGLIDE 27X1.5 (NEEDLE) IMPLANT
PACK BASIN DAY SURGERY FS (CUSTOM PROCEDURE TRAY) ×1 IMPLANT
PENCIL SMOKE EVACUATOR (MISCELLANEOUS) IMPLANT
POWDER MYRIAD MORCLLS FINE 500 (Miscellaneous) IMPLANT
SHEET MEDIUM DRAPE 40X70 STRL (DRAPES) IMPLANT
SLEEVE SCD COMPRESS KNEE MED (STOCKING) IMPLANT
SOLN 0.9% NACL POUR BTL 1000ML (IV SOLUTION) IMPLANT
SPIKE FLUID TRANSFER (MISCELLANEOUS) IMPLANT
STRIP CLOSURE SKIN 1/2X4 (GAUZE/BANDAGES/DRESSINGS) IMPLANT
SUCTION TUBE FRAZIER 10FR DISP (SUCTIONS) IMPLANT
SUT MNCRL 6-0 UNDY P1 1X18 (SUTURE) IMPLANT
SUT MNCRL AB 3-0 PS2 18 (SUTURE) IMPLANT
SUT MNCRL AB 3-0 PS2 27 (SUTURE) IMPLANT
SUT MNCRL AB 4-0 PS2 18 (SUTURE) IMPLANT
SUT MON AB 5-0 P3 18 (SUTURE) IMPLANT
SUT MON AB 5-0 PS2 18 (SUTURE) IMPLANT
SUT PDS II 3-0 CT2 27 ABS (SUTURE) IMPLANT
SUT PROLENE 5 0 P 3 (SUTURE) IMPLANT
SUT PROLENE 5 0 PS 2 (SUTURE) IMPLANT
SUT PROLENE 6 0 P 1 18 (SUTURE) IMPLANT
SUT VIC AB 3-0 PS2 18XBRD (SUTURE) IMPLANT
SUT VIC AB 4-0 PS2 18 (SUTURE) IMPLANT
SUT VIC AB 5-0 P-3 18X BRD (SUTURE) IMPLANT
SUT VIC AB 5-0 PS2 18 (SUTURE) IMPLANT
SYR BULB EAR ULCER 3OZ GRN STR (SYRINGE) IMPLANT
SYR CONTROL 10ML LL (SYRINGE) ×1 IMPLANT
TOWEL GREEN STERILE FF (TOWEL DISPOSABLE) ×1 IMPLANT
TRAY DSU PREP LF (CUSTOM PROCEDURE TRAY) ×1 IMPLANT
TUBE CONNECTING 20X1/4 (TUBING) IMPLANT

## 2024-08-10 NOTE — Interval H&P Note (Signed)
 History and Physical Interval Note:  08/10/2024 8:32 AM  Danielle Burnett  has presented today for surgery, with the diagnosis of mass on back.  The various methods of treatment have been discussed with the patient and family. After consideration of risks, benefits and other options for treatment, the patient has consented to  Procedure(s): EXCISION MASS, BACK (N/A) as a surgical intervention.  The patient's history has been reviewed, patient examined, no change in status, stable for surgery.  I have reviewed the patient's chart and labs.  Questions were answered to the patient's satisfaction.     Estefana RAMAN Manda Holstad

## 2024-08-10 NOTE — Discharge Instructions (Addendum)
 INSTRUCTIONS FOR AFTER SURGERY   You will likely have some questions about what to expect following your operation.  The following information will help you and your family understand what to expect when you are discharged from the hospital.  It is important to follow these guidelines to help ensure a smooth recovery and reduce complication.  Postoperative instructions include information on: diet, wound care, medications and physical activity.  AFTER SURGERY Expect to go home after the procedure.  In some cases, you may need to spend one night in the hospital for observation.  DIET Surgery does not require a specific diet.  However, the healthier you eat the better your body will heal. It is important to increasing your protein intake.  This means limiting the foods with sugar and carbohydrates.  Focus on vegetables and some meat.  If you have liposuction during your procedure be sure to drink water.  If your urine is bright yellow, then it is concentrated, and you need to drink more water.  As a general rule after surgery, you should have 8 ounces of water every hour while awake.  If you find you are persistently nauseated or unable to take in liquids let us  know.  NO TOBACCO USE or EXPOSURE.  This will slow your healing process and lead to a wound.  WOUND CARE Leave the binder on for 3 days . Use fragrance free soap like Dial, Dove or Ivory.   After 3 days you can remove the binder to shower. Once dry apply binder or sports bra. If you have steri-strips / tape directly attached to your skin leave them in place. It is OK to get these wet.   No baths, pools or hot tubs for four weeks. We close your incision to leave the smallest and best-looking scar. No ointment or creams on your incisions for four weeks.  No Neosporin (Too many skin reactions).  A few weeks after surgery you can use Mederma and start massaging the scar. We ask you to wear your binder or sports bra for the first 6 weeks around the  clock, including while sleeping. This provides added comfort and helps reduce the fluid accumulation at the surgery site. You can use Ice to the operative site.    ACTIVITY No heavy lifting until cleared by the doctor.  This usually means no more than a half-gallon of milk.  It is OK to walk and climb stairs. Moving your legs is very important to decrease your risk of a blood clot.  It will also help keep you from getting deconditioned.  Every 1 to 2 hours get up and walk for 5 minutes. This will help with a quicker recovery back to normal.  Let pain be your guide so you don't do too much.  This time is for you to recover.  You will be more comfortable if you sleep and rest with your head elevated either with a few pillows under you or in a recliner.  No stomach sleeping for a three months.  WORK Everyone returns to work at different times. As a rough guide, most people take at least 1 - 2 weeks off prior to returning to work. If you need documentation for your job, give the forms to the front staff at the clinic.  DRIVING Arrange for someone to bring you home from the hospital after your surgery.  You may be able to drive a few days after surgery but not while taking any narcotics or valium .  BOWEL  MOVEMENTS Constipation can occur after anesthesia and while taking pain medication.  It is important to stay ahead for your comfort.  We recommend taking Milk of Magnesia (2 tablespoons; twice a day) while taking the pain pills.  MEDICATIONS You may be prescribed should start after surgery At your preoperative visit for you history and physical you were given the following medications: Antibiotic: Start this medication when you get home and take according to the instructions on the bottle. Zofran  4 mg:  This is to treat nausea and vomiting.  You can take this every 6 hours as needed and only if needed. Oxycodone  5 mg every 6 hours for 3 - 5 days.  This is to be used after you have taken the Motrin  or  the Tylenol . 4.   Gabapentin 300 mg every 12 hours for 7 days.  Over the counter Medication to take: Ibuprofen  (Motrin ) 400 - 600 mg every 6 hour for 7 days Tylenol  500 mg every 6 hours for 7 days.  Only take the Oxycodone  after you have tried these two. MiraLAX or stool softener of choice: Take this according to the bottle if you take the Norco.  If muscle work done:  Flexeril 5 mg every 12 hours for 7 days.  WHEN TO CALL Call your surgeon's office if any of the following occur: Fever 101 degrees F or greater Excessive bleeding or fluid from the incision site. Pain that increases over time without aid from the medications Redness, warmth, or pus draining from incision sites Persistent nausea or inability to take in liquids Severe misshapen area that underwent the operation.  *You had 1000 mg of Tylenol  at 9:00am   Post Anesthesia Home Care Instructions  Activity: Get plenty of rest for the remainder of the day. A responsible individual must stay with you for 24 hours following the procedure.  For the next 24 hours, DO NOT: -Drive a car -Advertising copywriter -Drink alcoholic beverages -Take any medication unless instructed by your physician -Make any legal decisions or sign important papers.  Meals: Start with liquid foods such as gelatin or soup. Progress to regular foods as tolerated. Avoid greasy, spicy, heavy foods. If nausea and/or vomiting occur, drink only clear liquids until the nausea and/or vomiting subsides. Call your physician if vomiting continues.  Special Instructions/Symptoms: Your throat may feel dry or sore from the anesthesia or the breathing tube placed in your throat during surgery. If this causes discomfort, gargle with warm salt water. The discomfort should disappear within 24 hours.  If you had a scopolamine patch placed behind your ear for the management of post- operative nausea and/or vomiting:  1. The medication in the patch is effective for 72  hours, after which it should be removed.  Wrap patch in a tissue and discard in the trash. Wash hands thoroughly with soap and water. 2. You may remove the patch earlier than 72 hours if you experience unpleasant side effects which may include dry mouth, dizziness or visual disturbances. 3. Avoid touching the patch. Wash your hands with soap and water after contact with the patch.  Information for Discharge Teaching: EXPAREL  (bupivacaine  liposome injectable suspension)   Pain relief is important to your recovery. The goal is to control your pain so you can move easier and return to your normal activities as soon as possible after your procedure. Your physician may use several types of medicines to manage pain, swelling, and more.  Your surgeon or anesthesiologist gave you EXPAREL (bupivacaine ) to help control your  pain after surgery.  EXPAREL  is a local anesthetic designed to release slowly over an extended period of time to provide pain relief by numbing the tissue around the surgical site. EXPAREL  is designed to release pain medication over time and can control pain for up to 72 hours. Depending on how you respond to EXPAREL , you may require less pain medication during your recovery. EXPAREL  can help reduce or eliminate the need for opioids during the first few days after surgery when pain relief is needed the most. EXPAREL  is not an opioid and is not addictive. It does not cause sleepiness or sedation.   Important! A teal colored band has been placed on your arm with the date, time and amount of EXPAREL  you have received. Please leave this armband in place for the full 96 hours following administration, and then you may remove the band. If you return to the hospital for any reason within 96 hours following the administration of EXPAREL , the armband provides important information that your health care providers to know, and alerts them that you have received this anesthetic.    Possible side  effects of EXPAREL : Temporary loss of sensation or ability to move in the area where medication was injected. Nausea, vomiting, constipation Rarely, numbness and tingling in your mouth or lips, lightheadedness, or anxiety may occur. Call your doctor right away if you think you may be experiencing any of these sensations, or if you have other questions regarding possible side effects.  Follow all other discharge instructions given to you by your surgeon or nurse. Eat a healthy diet and drink plenty of water or other fluids.

## 2024-08-10 NOTE — Anesthesia Preprocedure Evaluation (Signed)
 Anesthesia Evaluation  Patient identified by MRN, date of birth, ID band Patient awake    Reviewed: Allergy & Precautions, NPO status , Patient's Chart, lab work & pertinent test results  History of Anesthesia Complications Negative for: history of anesthetic complications  Airway Mallampati: III  TM Distance: >3 FB Neck ROM: Full    Dental no notable dental hx. (+) Teeth Intact, Dental Advisory Given   Pulmonary    Pulmonary exam normal breath sounds clear to auscultation       Cardiovascular hypertension, Pt. on medications (-) angina (-) Past MI Normal cardiovascular exam Rhythm:Regular Rate:Normal     Neuro/Psych negative neurological ROS  negative psych ROS   GI/Hepatic negative GI ROS, Neg liver ROS,,,  Endo/Other  Hypothyroidism    Renal/GU Lab Results      Component                Value               Date                                 K                        3.6                 03/12/2024                             CREATININE               0.69                03/12/2024                        Musculoskeletal   Abdominal   Peds  Hematology Lab Results      Component                Value               Date                      WBC                      9.5                 03/12/2024                HGB                      13.1                03/12/2024                HCT                      38.9                03/12/2024                MCV                      85.7                03/12/2024  PLT                      283                 03/12/2024              Anesthesia Other Findings All: Cipro Flagyl  Reproductive/Obstetrics                              Anesthesia Physical Anesthesia Plan  ASA: 2  Anesthesia Plan: General   Post-op Pain Management: Toradol  IV (intra-op)* and Ofirmev  IV (intra-op)*   Induction: Intravenous  PONV Risk Score and Plan:  4 or greater and Treatment may vary due to age or medical condition, Midazolam , Dexamethasone  and Ondansetron   Airway Management Planned: Oral ETT  Additional Equipment: None  Intra-op Plan:   Post-operative Plan: Extubation in OR  Informed Consent: I have reviewed the patients History and Physical, chart, labs and discussed the procedure including the risks, benefits and alternatives for the proposed anesthesia with the patient or authorized representative who has indicated his/her understanding and acceptance.     Dental advisory given  Plan Discussed with: CRNA and Surgeon  Anesthesia Plan Comments:         Anesthesia Quick Evaluation

## 2024-08-10 NOTE — Anesthesia Procedure Notes (Addendum)
 Procedure Name: Intubation Date/Time: 08/10/2024 9:17 AM  Performed by: Emilio Rock BIRCH, CRNAPre-anesthesia Checklist: Patient identified, Emergency Drugs available, Suction available and Patient being monitored Patient Re-evaluated:Patient Re-evaluated prior to induction Oxygen Delivery Method: Circle system utilized Preoxygenation: Pre-oxygenation with 100% oxygen Induction Type: IV induction Ventilation: Mask ventilation without difficulty Laryngoscope Size: Mac and 1 Grade View: Grade II Tube type: Oral Number of attempts: 1 Airway Equipment and Method: Stylet and Oral airway Placement Confirmation: ETT inserted through vocal cords under direct vision, positive ETCO2 and breath sounds checked- equal and bilateral Secured at: 22 cm Tube secured with: Tape Dental Injury: Teeth and Oropharynx as per pre-operative assessment

## 2024-08-10 NOTE — Anesthesia Postprocedure Evaluation (Signed)
 Anesthesia Post Note  Patient: Danielle Burnett  Procedure(s) Performed: EXCISION MASS, BACK (Right: Back)     Patient location during evaluation: PACU Anesthesia Type: General Level of consciousness: awake and alert Pain management: pain level controlled Vital Signs Assessment: post-procedure vital signs reviewed and stable Respiratory status: spontaneous breathing, nonlabored ventilation, respiratory function stable and patient connected to nasal cannula oxygen Cardiovascular status: blood pressure returned to baseline and stable Postop Assessment: no apparent nausea or vomiting Anesthetic complications: no   No notable events documented.  Last Vitals:  Vitals:   08/10/24 1100 08/10/24 1115  BP: 116/67 127/75  Pulse: (!) 53 64  Resp: 13 16  Temp:  (!) 36.2 C  SpO2: 100% 99%    Last Pain:  Vitals:   08/10/24 1115  TempSrc:   PainSc: 0-No pain                 Epifanio Lamar BRAVO

## 2024-08-10 NOTE — Op Note (Signed)
 DATE OF OPERATION: 08/10/2024  LOCATION: [facility]  PREOPERATIVE DIAGNOSIS: right back mass  POSTOPERATIVE DIAGNOSIS: Same  PROCEDURE: Excision of right back lipoma 4 x 5 cm  SURGEON: Estefana Fritter, DO  ASSISTANT: Estefana Peck, PA  EBL: none  CONDITION: Stable  COMPLICATIONS: None  INDICATION: The patient, Danielle Burnett, is a 53 y.o. female born on 01-15-1971, is here for treatment of a mass on the right back.   PROCEDURE DETAILS:  The patient was seen prior to surgery and marked.  The IV antibiotics were given. The patient was taken to the operating room and given a general anesthetic. A standard time out was performed and all information was confirmed by those in the room. SCDs were placed.   The patient was placed in the prone position.  She was prepped and draped.  Local with epinephrine was placed under the skin.  The #15 blade was used to make an incision.  The bovie was used to dissect to the mass.  It looked like a lipoma but more fibrous than usual.  I was concerned that there might be a lipoma under the muscle.  I separated the fibers and there was not an additional lipoma. The muscle fibers were realigned with 3-0 Vicryl.  The deep layers of the dermis were closed with the 3-0 PDS.  A 4-0 Monocryl was used to close the skin.  Derma bond and steri strips were applied.  The lesion was 4 x 5 cm.  Myriad (500 mg) was placed in the pocket before closure.  The patient was allowed to wake up and taken to recovery room in stable condition at the end of the case. The family was notified at the end of the case.   The advanced practice practitioner (APP) assisted throughout the case.  The APP was essential in retraction and counter traction when needed to make the case progress smoothly.  This retraction and assistance made it possible to see the tissue plans for the procedure.  The assistance was needed for blood control, tissue re-approximation and assisted with  closure of the incision site.

## 2024-08-10 NOTE — Transfer of Care (Signed)
 Immediate Anesthesia Transfer of Care Note  Patient: Danielle Burnett  Procedure(s) Performed: EXCISION MASS, BACK (Right: Back)  Patient Location: PACU  Anesthesia Type:General  Level of Consciousness: awake, alert , oriented, drowsy, and patient cooperative  Airway & Oxygen Therapy: Patient Spontanous Breathing and Patient connected to face mask oxygen  Post-op Assessment: Report given to RN and Post -op Vital signs reviewed and stable  Post vital signs: Reviewed and stable  Last Vitals:  Vitals Value Taken Time  BP 103/70 08/10/24 10:22  Temp    Pulse 68 08/10/24 10:24  Resp 14 08/10/24 10:24  SpO2 100 % 08/10/24 10:24  Vitals shown include unfiled device data.  Last Pain:  Vitals:   08/10/24 0755  TempSrc: Temporal  PainSc: 0-No pain      Patients Stated Pain Goal: 3 (08/10/24 0755)  Complications: No notable events documented.

## 2024-08-11 ENCOUNTER — Encounter (HOSPITAL_BASED_OUTPATIENT_CLINIC_OR_DEPARTMENT_OTHER): Payer: Self-pay | Admitting: Plastic Surgery

## 2024-08-14 LAB — SURGICAL PATHOLOGY

## 2024-08-18 ENCOUNTER — Ambulatory Visit: Admitting: Plastic Surgery

## 2024-08-18 ENCOUNTER — Encounter: Payer: Self-pay | Admitting: Plastic Surgery

## 2024-08-18 VITALS — BP 144/83 | HR 73 | Ht 62.5 in | Wt 215.4 lb

## 2024-08-18 DIAGNOSIS — R222 Localized swelling, mass and lump, trunk: Secondary | ICD-10-CM

## 2024-08-18 DIAGNOSIS — Z9889 Other specified postprocedural states: Secondary | ICD-10-CM

## 2024-08-18 NOTE — Progress Notes (Signed)
 The patient is a 53 year old female who had a lipoma excised from her right back last week.  Overall she is doing really well.  There is no sign of a hematoma and I do not see any sign of a seroma either.  No sign of infection.  Is a little bit sore as expected.  She is fine to shower and get the area wet.  I took the outer dressing off.  She should keep the Steri-Strips in place and will take them off at her next visit.

## 2024-09-01 ENCOUNTER — Encounter: Payer: Self-pay | Admitting: Student

## 2024-09-01 ENCOUNTER — Ambulatory Visit: Admitting: Student

## 2024-09-01 VITALS — BP 116/70 | HR 75 | Ht 62.5 in | Wt 212.2 lb

## 2024-09-01 DIAGNOSIS — Z9889 Other specified postprocedural states: Secondary | ICD-10-CM

## 2024-09-01 DIAGNOSIS — R222 Localized swelling, mass and lump, trunk: Secondary | ICD-10-CM

## 2024-09-01 NOTE — Progress Notes (Signed)
 Patient is a 53 year old female who recently underwent excision of right back mass with Dr. Lowery on 08/10/2024.  Mass was sent to pathology and was consistent with lipoma.  Patient is about 3 weeks postop.  She presents to the clinic today for postoperative follow-up.  Patient was most recently seen in the clinic on 08/18/2024.  At this visit, patient was doing well.  There was no sign of hematoma or seroma.  There is no sign of infection.  Steri-Strips were left in place.  Today, patient reports she is doing well.  She has no new issues, complaints or concerns today.  She denies any fevers, chills or drainage.  Chaperone present on exam.  On exam, patient is sitting upright in no acute distress.  Steri-Strips are in place over the incision.  These were removed.  Incision overall appears to be healing well.  There is some superficial scabbing noted to the medial aspect of the incision and Monocryl sutures protruding out from the lateral aspect of the incision.  This was cut and removed.  Patient tolerated well.  There is no swelling noted on exam.  No tenderness to palpation.  No surrounding erythema.  Recommend that patient apply Vaseline to her incision daily for the next 3 weeks.  Discussed with her she may gently massage her incision.  Discussed with her that after that, she may use scar creams such as Mederma, silicone creams or tapes.  Patient's breast understanding.  We discussed that she should avoid direct sunlight as this can worsen the scar.  She expressed understanding.  Discussed with the patient I would still like her to avoid any heavy activities for the next 3 weeks.  She expressed understanding.  Patient has another appointment in a few weeks.  I discussed with her that if she is feeling well and does not have any concerns she may cancel this appointment.  I discussed with her though that if she would like to be reevaluated we are more than happy to see her.  She expressed  understanding.  I instructed the patient to call if she has any questions or concerns about anything.

## 2024-09-11 DIAGNOSIS — R051 Acute cough: Secondary | ICD-10-CM | POA: Diagnosis not present

## 2024-09-11 DIAGNOSIS — J45909 Unspecified asthma, uncomplicated: Secondary | ICD-10-CM | POA: Diagnosis not present

## 2024-09-15 ENCOUNTER — Encounter: Admitting: Student

## 2024-10-18 ENCOUNTER — Other Ambulatory Visit: Payer: Self-pay
